# Patient Record
Sex: Female | Born: 1965 | Race: Black or African American | Hispanic: No | Smoking: Never smoker
Health system: Southern US, Community
[De-identification: ages and names within clinical notes are randomized; demographics above are authoritative.]

## PROBLEM LIST (undated history)

## (undated) DIAGNOSIS — I10 Essential (primary) hypertension: Secondary | ICD-10-CM

## (undated) DIAGNOSIS — F419 Anxiety disorder, unspecified: Secondary | ICD-10-CM

## (undated) HISTORY — PX: CERVICAL BIOPSY: SHX590

---

## 2006-01-11 ENCOUNTER — Emergency Department (HOSPITAL_COMMUNITY): Admission: EM | Admit: 2006-01-11 | Discharge: 2006-01-11 | Payer: Self-pay | Admitting: *Deleted

## 2008-08-28 ENCOUNTER — Encounter: Admission: RE | Admit: 2008-08-28 | Discharge: 2008-08-28 | Payer: Self-pay | Admitting: Obstetrics and Gynecology

## 2008-09-03 ENCOUNTER — Encounter: Admission: RE | Admit: 2008-09-03 | Discharge: 2008-09-03 | Payer: Self-pay | Admitting: Obstetrics and Gynecology

## 2011-08-20 ENCOUNTER — Other Ambulatory Visit (HOSPITAL_COMMUNITY)
Admission: RE | Admit: 2011-08-20 | Discharge: 2011-08-20 | Disposition: A | Discharge status: Home / Self Care | Payer: BC Managed Care – PPO | Source: Ambulatory Visit | Attending: Obstetrics and Gynecology | Admitting: Obstetrics and Gynecology

## 2011-08-20 ENCOUNTER — Other Ambulatory Visit: Payer: Self-pay | Admitting: Obstetrics and Gynecology

## 2011-08-20 DIAGNOSIS — Z124 Encounter for screening for malignant neoplasm of cervix: Secondary | ICD-10-CM | POA: Insufficient documentation

## 2011-08-20 DIAGNOSIS — Z1231 Encounter for screening mammogram for malignant neoplasm of breast: Secondary | ICD-10-CM

## 2011-08-20 DIAGNOSIS — Z1159 Encounter for screening for other viral diseases: Secondary | ICD-10-CM | POA: Insufficient documentation

## 2011-09-16 ENCOUNTER — Ambulatory Visit: Payer: Self-pay

## 2011-09-21 ENCOUNTER — Ambulatory Visit
Admission: RE | Admit: 2011-09-21 | Discharge: 2011-09-21 | Disposition: A | Discharge status: Home / Self Care | Payer: BC Managed Care – PPO | Source: Ambulatory Visit | Attending: Obstetrics and Gynecology | Admitting: Obstetrics and Gynecology

## 2011-09-21 DIAGNOSIS — Z1231 Encounter for screening mammogram for malignant neoplasm of breast: Secondary | ICD-10-CM

## 2012-06-22 DIAGNOSIS — B181 Chronic viral hepatitis B without delta-agent: Secondary | ICD-10-CM | POA: Insufficient documentation

## 2012-06-23 DIAGNOSIS — D509 Iron deficiency anemia, unspecified: Secondary | ICD-10-CM | POA: Insufficient documentation

## 2012-12-24 DIAGNOSIS — I10 Essential (primary) hypertension: Secondary | ICD-10-CM | POA: Insufficient documentation

## 2015-01-12 DIAGNOSIS — Q513 Bicornate uterus: Secondary | ICD-10-CM | POA: Insufficient documentation

## 2015-04-06 DIAGNOSIS — F251 Schizoaffective disorder, depressive type: Secondary | ICD-10-CM | POA: Insufficient documentation

## 2015-04-10 DIAGNOSIS — E876 Hypokalemia: Secondary | ICD-10-CM | POA: Insufficient documentation

## 2015-04-19 ENCOUNTER — Emergency Department (HOSPITAL_BASED_OUTPATIENT_CLINIC_OR_DEPARTMENT_OTHER)
Admission: EM | Admit: 2015-04-19 | Discharge: 2015-04-19 | Disposition: A | Discharge status: Other Acute Inpatient Hospital | Payer: BC Managed Care – PPO | Attending: Emergency Medicine | Admitting: Emergency Medicine

## 2015-04-19 ENCOUNTER — Encounter (HOSPITAL_BASED_OUTPATIENT_CLINIC_OR_DEPARTMENT_OTHER): Payer: Self-pay

## 2015-04-19 ENCOUNTER — Emergency Department (HOSPITAL_COMMUNITY): Payer: BC Managed Care – PPO

## 2015-04-19 ENCOUNTER — Emergency Department (HOSPITAL_COMMUNITY)
Admission: EM | Admit: 2015-04-19 | Discharge: 2015-04-19 | Disposition: A | Discharge status: Home / Self Care | Payer: BC Managed Care – PPO | Attending: Emergency Medicine | Admitting: Emergency Medicine

## 2015-04-19 ENCOUNTER — Encounter (HOSPITAL_COMMUNITY): Payer: Self-pay | Admitting: Emergency Medicine

## 2015-04-19 ENCOUNTER — Inpatient Hospital Stay (HOSPITAL_COMMUNITY)
Admission: AD | Admit: 2015-04-19 | Discharge: 2015-04-23 | DRG: 885 | Disposition: A | Discharge status: Other Acute Inpatient Hospital | Payer: BC Managed Care – PPO | Source: Intra-hospital | Attending: Emergency Medicine | Admitting: Emergency Medicine

## 2015-04-19 ENCOUNTER — Encounter (HOSPITAL_COMMUNITY): Payer: Self-pay

## 2015-04-19 DIAGNOSIS — F99 Mental disorder, not otherwise specified: Secondary | ICD-10-CM

## 2015-04-19 DIAGNOSIS — J189 Pneumonia, unspecified organism: Secondary | ICD-10-CM | POA: Diagnosis present

## 2015-04-19 DIAGNOSIS — R0602 Shortness of breath: Secondary | ICD-10-CM | POA: Diagnosis not present

## 2015-04-19 DIAGNOSIS — F419 Anxiety disorder, unspecified: Secondary | ICD-10-CM | POA: Insufficient documentation

## 2015-04-19 DIAGNOSIS — F259 Schizoaffective disorder, unspecified: Secondary | ICD-10-CM | POA: Diagnosis not present

## 2015-04-19 DIAGNOSIS — R1011 Right upper quadrant pain: Secondary | ICD-10-CM | POA: Diagnosis not present

## 2015-04-19 DIAGNOSIS — I10 Essential (primary) hypertension: Secondary | ICD-10-CM | POA: Diagnosis present

## 2015-04-19 DIAGNOSIS — Z8249 Family history of ischemic heart disease and other diseases of the circulatory system: Secondary | ICD-10-CM

## 2015-04-19 DIAGNOSIS — F061 Catatonic disorder due to known physiological condition: Secondary | ICD-10-CM | POA: Clinically undetermined

## 2015-04-19 DIAGNOSIS — K5900 Constipation, unspecified: Secondary | ICD-10-CM | POA: Diagnosis present

## 2015-04-19 DIAGNOSIS — R918 Other nonspecific abnormal finding of lung field: Secondary | ICD-10-CM | POA: Diagnosis present

## 2015-04-19 DIAGNOSIS — F251 Schizoaffective disorder, depressive type: Secondary | ICD-10-CM | POA: Diagnosis not present

## 2015-04-19 DIAGNOSIS — R000 Tachycardia, unspecified: Secondary | ICD-10-CM | POA: Insufficient documentation

## 2015-04-19 DIAGNOSIS — F339 Major depressive disorder, recurrent, unspecified: Secondary | ICD-10-CM | POA: Diagnosis present

## 2015-04-19 DIAGNOSIS — Z3202 Encounter for pregnancy test, result negative: Secondary | ICD-10-CM | POA: Insufficient documentation

## 2015-04-19 DIAGNOSIS — Z8659 Personal history of other mental and behavioral disorders: Secondary | ICD-10-CM

## 2015-04-19 DIAGNOSIS — G4700 Insomnia, unspecified: Secondary | ICD-10-CM | POA: Diagnosis present

## 2015-04-19 DIAGNOSIS — Z0000 Encounter for general adult medical examination without abnormal findings: Secondary | ICD-10-CM | POA: Diagnosis present

## 2015-04-19 DIAGNOSIS — Z79899 Other long term (current) drug therapy: Secondary | ICD-10-CM | POA: Insufficient documentation

## 2015-04-19 DIAGNOSIS — D509 Iron deficiency anemia, unspecified: Secondary | ICD-10-CM | POA: Diagnosis not present

## 2015-04-19 DIAGNOSIS — R110 Nausea: Secondary | ICD-10-CM | POA: Insufficient documentation

## 2015-04-19 DIAGNOSIS — F333 Major depressive disorder, recurrent, severe with psychotic symptoms: Secondary | ICD-10-CM | POA: Diagnosis not present

## 2015-04-19 DIAGNOSIS — R079 Chest pain, unspecified: Secondary | ICD-10-CM | POA: Diagnosis present

## 2015-04-19 DIAGNOSIS — R45851 Suicidal ideations: Secondary | ICD-10-CM | POA: Diagnosis present

## 2015-04-19 DIAGNOSIS — Z9114 Patient's other noncompliance with medication regimen: Secondary | ICD-10-CM | POA: Diagnosis not present

## 2015-04-19 HISTORY — DX: Essential (primary) hypertension: I10

## 2015-04-19 HISTORY — DX: Anxiety disorder, unspecified: F41.9

## 2015-04-19 LAB — CBC WITH DIFFERENTIAL/PLATELET
BASOS ABS: 0 10*3/uL (ref 0.0–0.1)
BASOS PCT: 0 %
EOS ABS: 0.1 10*3/uL (ref 0.0–0.7)
Eosinophils Relative: 1 %
HEMATOCRIT: 35.8 % — AB (ref 36.0–46.0)
HEMOGLOBIN: 12.4 g/dL (ref 12.0–15.0)
LYMPHS PCT: 16 %
Lymphs Abs: 1.3 10*3/uL (ref 0.7–4.0)
MCH: 26.6 pg (ref 26.0–34.0)
MCHC: 34.6 g/dL (ref 30.0–36.0)
MCV: 76.7 fL — ABNORMAL LOW (ref 78.0–100.0)
Monocytes Absolute: 0.8 10*3/uL (ref 0.1–1.0)
Monocytes Relative: 10 %
NEUTROS PCT: 73 %
Neutro Abs: 5.8 10*3/uL (ref 1.7–7.7)
Platelets: 164 10*3/uL (ref 150–400)
RBC: 4.67 MIL/uL (ref 3.87–5.11)
RDW: 17.7 % — ABNORMAL HIGH (ref 11.5–15.5)
WBC: 8 10*3/uL (ref 4.0–10.5)

## 2015-04-19 LAB — BASIC METABOLIC PANEL
ANION GAP: 7 (ref 5–15)
BUN: 7 mg/dL (ref 6–20)
CALCIUM: 9 mg/dL (ref 8.9–10.3)
CHLORIDE: 103 mmol/L (ref 101–111)
CO2: 26 mmol/L (ref 22–32)
Creatinine, Ser: 0.73 mg/dL (ref 0.44–1.00)
GFR calc non Af Amer: >60 mL/min (ref >60)
Glucose, Bld: 91 mg/dL (ref 65–99)
Potassium: 4.1 mmol/L (ref 3.5–5.1)
Sodium: 136 mmol/L (ref 135–145)

## 2015-04-19 LAB — COMPREHENSIVE METABOLIC PANEL
ALT: 24 U/L (ref 14–54)
AST: 27 U/L (ref 15–41)
Albumin: 4 g/dL (ref 3.5–5.0)
Alkaline Phosphatase: 95 U/L (ref 38–126)
Anion gap: 9 (ref 5–15)
BUN: 9 mg/dL (ref 6–20)
CO2: 25 mmol/L (ref 22–32)
Calcium: 9.1 mg/dL (ref 8.9–10.3)
Chloride: 102 mmol/L (ref 101–111)
Creatinine, Ser: 0.75 mg/dL (ref 0.44–1.00)
GFR calc Af Amer: >60 mL/min (ref >60)
GFR calc non Af Amer: >60 mL/min (ref >60)
Glucose, Bld: 90 mg/dL (ref 65–99)
Potassium: 4 mmol/L (ref 3.5–5.1)
Sodium: 136 mmol/L (ref 135–145)
Total Bilirubin: 0.7 mg/dL (ref 0.3–1.2)
Total Protein: 8.3 g/dL — ABNORMAL HIGH (ref 6.5–8.1)

## 2015-04-19 LAB — CBC
HCT: 34.1 % — ABNORMAL LOW (ref 36.0–46.0)
Hemoglobin: 11.5 g/dL — ABNORMAL LOW (ref 12.0–15.0)
MCH: 26.4 pg (ref 26.0–34.0)
MCHC: 33.7 g/dL (ref 30.0–36.0)
MCV: 78.4 fL (ref 78.0–100.0)
Platelets: 164 10*3/uL (ref 150–400)
RBC: 4.35 MIL/uL (ref 3.87–5.11)
RDW: 17.8 % — ABNORMAL HIGH (ref 11.5–15.5)
WBC: 7.7 10*3/uL (ref 4.0–10.5)

## 2015-04-19 LAB — TROPONIN I
TROPONIN I: 0.04 ng/mL — AB (ref <0.031)
Troponin I: 0.04 ng/mL — ABNORMAL HIGH (ref <0.031)

## 2015-04-19 LAB — I-STAT BETA HCG BLOOD, ED (MC, WL, AP ONLY)

## 2015-04-19 LAB — RAPID URINE DRUG SCREEN, HOSP PERFORMED
Amphetamines: NOT DETECTED
Barbiturates: NOT DETECTED
Benzodiazepines: NOT DETECTED
Cocaine: NOT DETECTED
Opiates: NOT DETECTED
Tetrahydrocannabinol: NOT DETECTED

## 2015-04-19 LAB — ETHANOL

## 2015-04-19 MED ORDER — RISPERIDONE 2 MG PO TBDP
2.0000 mg | ORAL_TABLET | Freq: Two times a day (BID) | ORAL | Status: DC
  Administered 2015-04-19 – 2015-04-21 (×4): 2 mg via ORAL
  Filled 2015-04-19: qty 1
  Filled 2015-04-19: qty 2
  Filled 2015-04-19 (×7): qty 1
  Filled 2015-04-19: qty 2

## 2015-04-19 MED ORDER — HALOPERIDOL LACTATE 5 MG/ML IJ SOLN: 10.0000 mg | Freq: Once | INTRAMUSCULAR | Status: DC

## 2015-04-19 MED ORDER — ACETAMINOPHEN 325 MG PO TABS
650.0000 mg | ORAL_TABLET | Freq: Four times a day (QID) | ORAL | Status: DC | PRN
  Administered 2015-04-20: 650 mg via ORAL
  Filled 2015-04-19 (×3): qty 2

## 2015-04-19 MED ORDER — HYDROCHLOROTHIAZIDE 25 MG PO TABS
12.5000 mg | ORAL_TABLET | Freq: Every day | ORAL | Status: DC
  Filled 2015-04-19 (×2): qty 0.5

## 2015-04-19 MED ORDER — ASPIRIN 81 MG PO CHEW: 324.0000 mg | CHEWABLE_TABLET | Freq: Once | ORAL | Status: DC

## 2015-04-19 MED ORDER — MAGNESIUM HYDROXIDE 400 MG/5ML PO SUSP: 30.0000 mL | Freq: Every day | ORAL | Status: DC | PRN

## 2015-04-19 MED ORDER — ALUM & MAG HYDROXIDE-SIMETH 200-200-20 MG/5ML PO SUSP: 30.0000 mL | ORAL | Status: DC | PRN

## 2015-04-19 NOTE — ED Provider Notes (Signed)
Patient presents with acute psychosis. She has not reported any chest pain in the ED although a troponin was checked on arrival. It was minimally elevated. Her recheck troponin also shows the same value. This could be rate related she's been tachycardic here. Her tachycardia has resolved however. She had a recent serial troponins done [facility] yesterday that were negative. She has been complaining of some intermittent chest pain over the last few weeks according to family. Her mother states that she was admitted to the hospital in [place] overnight where she had a stress test within the last 2 weeks. She's also had a negative CT angiogram of the chest within the last 2 weeks. She currently is not complaining of any chest pain. I don't feel this point that she needs to be admitted for further cardiac evaluation given her recent studies. She has been [facility] to [facility].  Rolan Bucco, MD 04/19/15 [phone]

## 2015-04-19 NOTE — ED Notes (Signed)
Dr Radford Pax states to hold IM Haldol at this time,until [facility] assessment completed. Pt calm

## 2015-04-19 NOTE — ED Notes (Signed)
Pt states her family made her come in for "chest tightness" from yesterday-pt denies CP, tightness at this time-states she was seen in the ED last night for same

## 2015-04-19 NOTE — Progress Notes (Signed)
Psychoeducational Group Note  Date:  04/19/2015 Time:  2052  Group Topic/Focus:  Wrap-Up Group:   The focus of this group is to help patients review their daily goal of treatment and discuss progress on daily workbooks.  Participation Level: Did Not Attend  Participation Quality:  Not Applicable  Affect:  Not Applicable  Cognitive:  Not Applicable  Insight:  Not Applicable  Engagement in Group: Not Applicable  Additional Comments:  The patient did not attend group since she was asleep in her bed.   Hazle Coca S 04/19/2015, 8:52 PM

## 2015-04-19 NOTE — ED Notes (Signed)
In to assess patient, patient to bathroom for approximately 5 minutes, checked on patient she states she is okay, however, patient hesitant to leave bathroom, upon return to room, patient asked to sit on stretcher so that we could talk about her reasoning for being present in ED, pt kneeled down at bedside to pray, states to give her a moment.

## 2015-04-19 NOTE — ED Provider Notes (Signed)
CSN: [id number]     Arrival date & time 04/19/15  1314 History   First MD Initiated Contact with Patient 04/19/15 1338     Chief Complaint  Patient presents with  . Psychiatric Evaluation    HPI Grace Mack is a 49 y.o. female with history of schizoaffective disorder depressed type, anxiety, hypertension, who presents to the ED due to family concern about her behavior. Per discussion with patient's son who is 47 years old he states that states that his mother has been acting differently for the last 2-3 weeks. He is unaware of any psychiatric diagnosis for his mother. He states that his mother stated that she sees spirits in the house. Conversation with other relatives (mother of patient,a nd cousin), they state that patient was just hospitalized 2 weeks ago for not taking her medications due to her schizoaffective disorder. They say patient gets hospitalized and then discharged and does not take medications. She has episodes of just driving around without knowing where she is going and the police have to find her. She was discharged from hospital on 9/26. She follows with behavioral health at [facility] office in [facility]. Patient denies any suicidal and homicidal ideation. She does endorse hearing voices. She denies any visual hallucinations. Patient states she has not taken her medications.    Past Medical History  Diagnosis Date  . Hypertension   . Anxiety    Past Surgical History  Procedure Laterality Date  . Cervical biopsy     Family History  Problem Relation Age of Onset  . CAD Father    Social History  Substance Use Topics  . Smoking status: Never Smoker   . Smokeless tobacco: None  . Alcohol Use: No   OB History    No data available     Review of Systems  Respiratory: Negative for shortness of breath.   Cardiovascular: Negative for chest pain.  Psychiatric/Behavioral: Positive for hallucinations and decreased concentration. Negative for suicidal ideas. The patient is  nervous/anxious.   Also per HPI  Allergies  Review of patient's allergies indicates no known allergies.  Home Medications   Prior to Admission medications   Medication Sig Start Date End Date Taking? Authorizing Provider  ferrous sulfate 325 (65 FE) MG tablet Take 325 mg by mouth daily.    Historical Provider, MD  hydrochlorothiazide (HYDRODIURIL) 25 MG tablet Take 12.5 mg by mouth daily.    Historical Provider, MD  risperiDONE (RISPERDAL M-TABS) 2 MG disintegrating tablet Take 2 mg by mouth 2 (two) times daily. 04/09/15 05/09/15  Historical Provider, MD   BP 113/79 mmHg  Pulse 110  Temp(Src) 97.5 F (36.4 C) (Oral)  Resp 18  Ht  (1.626 m)  Wt 160 lb (72.576 kg)  BMI 27.45 kg/m2  SpO2 98%  LMP 04/07/2015 (Approximate) Physical Exam  Constitutional: She appears well-developed and well-nourished.  HENT:  Head: Normocephalic and atraumatic.  Eyes: EOM are normal.  Neck: Normal range of motion.  Cardiovascular: Regular rhythm and intact distal pulses.  Tachycardia present.   Pulmonary/Chest: Effort normal and breath sounds normal.  Abdominal: Soft. There is no tenderness.  Musculoskeletal: Normal range of motion.  Neurological: She is alert.  Psychiatric: Her affect is blunt. Her speech is delayed. She is slowed and withdrawn. She is inattentive.    ED Course  Procedures (including critical care time) Labs Review Labs Reviewed  CBC WITH DIFFERENTIAL/PLATELET - Abnormal; Notable for the following:    HCT 35.8 (*)  MCV 76.7 (*)    RDW 17.7 (*)    Platelets 149 (*)    All other components within normal limits  COMPREHENSIVE METABOLIC PANEL - Abnormal; Notable for the following:    Total Protein 8.3 (*)    All other components within normal limits  TROPONIN I - Abnormal; Notable for the following:    Troponin I 0.04 (*)    All other components within normal limits    Imaging Review Dg Chest 2 View  04/19/2015   CLINICAL DATA:  Chest pain for 2 weeks  EXAM:  CHEST  2 VIEW  COMPARISON:  04/01/2015 chest CT  FINDINGS: Normal heart size and mediastinal contours. No acute infiltrate or edema. No effusion or pneumothorax. No acute osseous findings.  IMPRESSION: Negative chest.   Electronically Signed   By: Marnee Spring M.D.   On: 04/19/2015 01:23   I have personally reviewed and evaluated these images and lab results as part of my medical decision-making.   EKG Interpretation   Date/Time:  Friday April 19 2015 13:51:32 EDT Ventricular Rate:  110 PR Interval:  134 QRS Duration: 78 QT Interval:  330 QTC Calculation: 446 R Axis:   44 Text Interpretation:  Sinus tachycardia Anterior infarct , age  undetermined Abnormal ECG Confirmed by BEATON  MD, ROBERT ([id number]) on  04/19/2015 1:53:35 PM      MDM   Final diagnoses:  Psychiatric disturbance  History of schizoaffective disorder    Patient presents to the ED due to family concerns for patient's mental state. Patient denies any current issues. No SI or HI. She has been seen several times in the last several days for chest pain which has all been unremarkable. Patient was recently admitted to [facility] on 9/21-9/26 for schizoaffective disorder and not being compliant with medications.   ACS work-up in ED unremarkable. Does have sinus tachycardia. ACS not high on differential. Normal CT Angio about two weeks ago. Previous enzymes over last 2 weeks all normal.   In ED patient with erratic concerning behavior. She will be IVC'd. Haldol ordered. Awaiting telepscyh evaluation and most likely admission to [facility] for further psychiatric evaluation.   Caryl Ada, DO 04/19/2015, 3:38 PM PGY-2, [facility]    Pincus Large, DO 04/19/15 1546  Pincus Large, DO 04/19/15 1546  Nelva Nay, MD 04/27/15 [phone]
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
## 2015-04-19 NOTE — ED Notes (Signed)
Pt states she has been having chest pain x 1 week. She has been to 4 hospitals in 4 days. All have medically cleared her.

## 2015-04-19 NOTE — Discharge Instructions (Signed)
Chest Pain (Nonspecific) Ms. Metts, See a primary care physician within 3 days for close follow up.  If symptoms worsen, come back to the ED immediately.  Thank you. It is often hard to give a diagnosis for the cause of chest pain. There is always a chance that your pain could be related to something serious, such as a heart attack or a blood clot in the lungs. You need to follow up with your doctor. HOME CARE  If antibiotic medicine was given, take it as directed by your doctor. Finish the medicine even if you start to feel better.  For the next few days, avoid activities that bring on chest pain. Continue physical activities as told by your doctor.  Do not use any tobacco products. This includes cigarettes, chewing tobacco, and e-cigarettes.  Avoid drinking alcohol.  Only take medicine as told by your doctor.  Follow your doctor's suggestions for more testing if your chest pain does not go away.  Keep all doctor visits you made. GET HELP IF:  Your chest pain does not go away, even after treatment.  You have a rash with blisters on your chest.  You have a fever. GET HELP RIGHT AWAY IF:   You have more pain or pain that spreads to your arm, neck, jaw, back, or belly (abdomen).  You have shortness of breath.  You cough more than usual or cough up blood.  You have very bad back or belly pain.  You feel sick to your stomach (nauseous) or throw up (vomit).  You have very bad weakness.  You pass out (faint).  You have chills. This is an emergency. Do not wait to see if the problems will go away. Call your local emergency services (911 in [place] Do not drive yourself to the hospital. MAKE SURE YOU:   Understand these instructions.  Will watch your condition.  Will get help right away if you are not doing well or get worse. Document Released: 12/23/2007 Document Revised: 07/11/2013 Document Reviewed: 12/23/2007 [facility] Patient Information 2015 [place], [place]. This  information is not intended to replace advice given to you by your health care provider. Make sure you discuss any questions you have with your health care provider.
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
## 2015-04-19 NOTE — ED Notes (Signed)
Pt given ice water by request, fed frozen healthy choice chicken dinner per her preference. Calm. NAD.

## 2015-04-19 NOTE — ED Notes (Signed)
Pt with flat affect and appears anxious-she is reluctant to answer my ?s and states her family made her come to ED "this in [address], right?"-she questioned what we were going to do and i explained to her she would get an EKG and he EDP would review her chart from [facility] yesterday to see if any other tests were needed-in the middle of triage pt asked me to stop asking her ?s and she needed to use the BR-pt walked to BR across the hall and back to triage w/o difficulty-during that time her minor son Barbara Cower in the room states pt has been to to [facility] this week (pt later states she has been to "[facility]" 2 days ago" for same c/o-pt and son both deny any BH hx-pt states "except anxiety"

## 2015-04-19 NOTE — ED Provider Notes (Addendum)
CSN: [id number]     Arrival date & time 04/19/15  0019 History  By signing my name below, I, Terrance Branch, attest that this documentation has been prepared under the direction and in the presence of Tomasita Crumble, MD. Electronically Signed: Evon Slack, ED Scribe. 04/19/2015. 2:19 AM.      Chief Complaint  Patient presents with  . Chest Pain   Patient is a 49 y.o. female presenting with chest pain. The history is provided by the patient. No language interpreter was used.  Chest Pain Pain location:  Substernal area Pain radiates to:  Does not radiate Pain radiates to the back: no   Pain severity:  Mild Onset quality:  Gradual Duration:  1 week Timing:  Intermittent Chronicity:  Recurrent Relieved by:  Nothing Worsened by:  Nothing tried Associated symptoms: nausea and shortness of breath   Associated symptoms: no diaphoresis and not vomiting    HPI Comments: Grace Mack is a 49 y.o. female who presents to the Emergency Department complaining of intermittent central CP onset 1 week prior. Pt states she has associated nausea. Pt does report SOB when the pain is at its worse. Pt doesn't report any alleviating or worsening symptoms. Pt doesn't report HX of lower leg edema. Pt denies any recent travel. Pt denies taking any hormones supplements. Denies vomiting or diaphoresis. Pt states she was evaluated in [facility] ED earlier today which showed no abnormalities.    Past Medical History  Diagnosis Date  . Hypertension    Past Surgical History  Procedure Laterality Date  . Cervical biopsy     Family History  Problem Relation Age of Onset  . CAD Father    Social History  Substance Use Topics  . Smoking status: Never Smoker   . Smokeless tobacco: None  . Alcohol Use: No   OB History    No data available      Review of Systems  Constitutional: Negative for diaphoresis.  Respiratory: Positive for shortness of breath.   Cardiovascular: Positive for chest pain.   Gastrointestinal: Positive for nausea. Negative for vomiting.  All other systems reviewed and are negative.    Allergies  Review of patient's allergies indicates no known allergies.  Home Medications   Prior to Admission medications   Medication Sig Start Date End Date Taking? Authorizing Provider  ferrous sulfate 325 (65 FE) MG tablet Take 325 mg by mouth daily.   Yes Historical Provider, MD  hydrochlorothiazide (HYDRODIURIL) 25 MG tablet Take 12.5 mg by mouth daily.   Yes Historical Provider, MD  risperiDONE (RISPERDAL M-TABS) 2 MG disintegrating tablet Take 2 mg by mouth 2 (two) times daily. 04/09/15 05/09/15 Yes Historical Provider, MD   BP 110/69 mmHg  Pulse 80  Temp(Src) 98.1 F (36.7 C) (Oral)  Resp 16  Ht  (1.626 m)  Wt 160 lb (72.576 kg)  BMI 27.45 kg/m2  SpO2 99%  LMP 04/07/2015 (Approximate)   Physical Exam  Constitutional: She is oriented to person, place, and time. She appears well-developed and well-nourished. No distress.  HENT:  Head: Normocephalic and atraumatic.  Nose: Nose normal.  Mouth/Throat: Oropharynx is clear and moist. No oropharyngeal exudate.  Eyes: Conjunctivae and EOM are normal. Pupils are equal, round, and reactive to light. No scleral icterus.  Neck: Normal range of motion. Neck supple. No JVD present. No tracheal deviation present. No thyromegaly present.  Cardiovascular: Normal rate, regular rhythm and normal heart sounds.  Exam reveals no gallop and no friction rub.  No murmur heard. Pulmonary/Chest: Effort normal and breath sounds normal. No respiratory distress. She has no wheezes. She exhibits no tenderness.  Abdominal: Soft. Bowel sounds are normal. She exhibits no distension and no mass. There is no tenderness. There is no rebound and no guarding.  Musculoskeletal: Normal range of motion. She exhibits no edema or tenderness.  Lymphadenopathy:    She has no cervical adenopathy.  Neurological: She is alert and oriented to person,  place, and time. No cranial nerve deficit. She exhibits normal muscle tone.  Skin: Skin is warm and dry. No rash noted. No erythema. No pallor.  Nursing note and vitals reviewed.   ED Course  Procedures (including critical care time) DIAGNOSTIC STUDIES: Oxygen Saturation is 99% on RA, normal by my interpretation.    COORDINATION OF CARE: 2:08 AM-Discussed treatment plan with pt at bedside and pt agreed to plan.     Labs Review Labs Reviewed  CBC - Abnormal; Notable for the following:    Hemoglobin 11.5 (*)    HCT 34.1 (*)    RDW 17.8 (*)    All other components within normal limits  BASIC METABOLIC PANEL  I-STAT BETA HCG BLOOD, ED ([facility], [facility], [facility] ONLY)    Imaging Review Dg Chest 2 View  04/19/2015   CLINICAL DATA:  Chest pain for 2 weeks  EXAM: CHEST  2 VIEW  COMPARISON:  04/01/2015 chest CT  FINDINGS: Normal heart size and mediastinal contours. No acute infiltrate or edema. No effusion or pneumothorax. No acute osseous findings.  IMPRESSION: Negative chest.   Electronically Signed   By: Marnee Spring M.D.   On: 04/19/2015 01:23      EKG Interpretation   Date/Time:  Friday April 19 2015 00:47:43 EDT Ventricular Rate:  79 PR Interval:  133 QRS Duration: 75 QT Interval:  366 QTC Calculation: 419 R Axis:   12 Text Interpretation:  Sinus rhythm No significant change since last  tracing Confirmed by Erroll Luna [phone]) on 04/19/2015 2:49:31 AM      MDM   Final diagnoses:  None    Patient presents emergency department for chest pain. Her history is not consistent with ACS, troponin is not warranted... Patient is PERC  negative. I have low concern for dissection or pericarditis with this patient. She appears well and in no acute distress. She is advised to see her primary care physician for further evaluation for chest pain. Her vital signs were within her normal limits and she is safe for discharge.  I personally performed the services described in this  documentation, which was scribed in my presence. The recorded information has been reviewed and is accurate.   h  Tomasita Crumble, MD 04/19/15 1914  Tomasita Crumble, MD 04/19/15 [phone]

## 2015-04-20 ENCOUNTER — Encounter (HOSPITAL_COMMUNITY): Payer: Self-pay | Admitting: Psychiatry

## 2015-04-20 MED ORDER — BENZTROPINE MESYLATE 1 MG PO TABS
1.0000 mg | ORAL_TABLET | Freq: Two times a day (BID) | ORAL | Status: DC
  Administered 2015-04-20 – 2015-04-21 (×2): 1 mg via ORAL
  Filled 2015-04-20 (×10): qty 1

## 2015-04-20 MED ORDER — HYDROCHLOROTHIAZIDE 12.5 MG PO CAPS
12.5000 mg | ORAL_CAPSULE | Freq: Every day | ORAL | Status: DC
  Administered 2015-04-20 – 2015-04-21 (×2): 12.5 mg via ORAL
  Filled 2015-04-20 (×8): qty 1

## 2015-04-20 MED ORDER — RISPERIDONE 2 MG PO TABS
2.0000 mg | ORAL_TABLET | Freq: Two times a day (BID) | ORAL | Status: DC
  Filled 2015-04-20 (×4): qty 1

## 2015-04-20 MED ORDER — FLUOXETINE HCL 20 MG PO CAPS
20.0000 mg | ORAL_CAPSULE | Freq: Every day | ORAL | Status: DC
  Administered 2015-04-20 – 2015-04-21 (×2): 20 mg via ORAL
  Filled 2015-04-20 (×5): qty 1

## 2015-04-20 MED ORDER — LORATADINE 10 MG PO TABS
10.0000 mg | ORAL_TABLET | Freq: Every day | ORAL | Status: DC
  Administered 2015-04-20 – 2015-04-21 (×2): 10 mg via ORAL
  Filled 2015-04-20 (×9): qty 1

## 2015-04-20 NOTE — Tx Team (Signed)
Initial Interdisciplinary Treatment Plan   PATIENT STRESSORS: Marital or family conflict Medication change or noncompliance   PATIENT STRENGTHS: Average or above average intelligence General fund of knowledge Supportive family/friends   PROBLEM LIST: Problem List/Patient Goals Date to be addressed Date deferred Reason deferred Estimated date of resolution  depression 04/19/2015     anxiety 04/19/2015     psychosis 04/19/2015     "my anxiety" 04/19/2015                                    DISCHARGE CRITERIA:  Ability to meet basic life and health needs Adequate post-discharge living arrangements Improved stabilization in mood, thinking, and/or behavior Motivation to continue treatment in a less acute level of care  PRELIMINARY DISCHARGE PLAN: Participate in family therapy Return to previous work or school arrangements  PATIENT/FAMIILY INVOLVEMENT: This treatment plan has been presented to and reviewed with the patient, AUTUMN PRUITT, The patient and family have been given the opportunity to ask questions and make suggestions.  JEHU-APPIAH, Donelle Hise K 04/20/2015, 12:49 AM

## 2015-04-20 NOTE — [facility] Group Notes (Signed)
[facility] Group Notes:  (Nursing/MHT/Case Management/Adjunct)  Date:  04/20/2015  Time:0900 Type of Therapy:  Nurse Education  Participation Level:  Did Not Attend   Summary of Progress/Problems:  Dara Hoyer 04/20/2015, 9:37 AM
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
## 2015-04-20 NOTE — [facility] Group Notes (Signed)
[facility] Group Notes: (Clinical Social Work)   04/20/2015      Type of Therapy:  Group Therapy   Participation Level:  Did Not Attend despite MHT prompting   Ambrose Mantle, LCSW 04/20/2015, 12:22 PM

## 2015-04-20 NOTE — Progress Notes (Signed)
Patient ID: Grace Mack, female   DOB: 1966/01/02, 49 y.o.   MRN: [id number] D: Patient in room on approach denies pain. Pt mood and affect appeared depressed and anxious. Pt isolative mostly to room. Pt denies SI/HI/AVH.  Cooperative with assessment. No physical distress noted.  A: Met with pt 1:1. Support and encouragement provided to attend groups and engage in milieu. Pt encouraged to discuss feelings and come to staff with any question or concerns.  R: Patient remains safe.
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
## 2015-04-20 NOTE — Progress Notes (Signed)
Patient ID: Grace Mack, female   DOB: [birth year], 49 y.o.   MRN: [id number] Pt in room c/o of right sided pain. Medication given by previous nurse. Pt encouraged to rest. Upon reassessment pt was asleep. Will continue to monitor

## 2015-04-20 NOTE — Progress Notes (Signed)
Pt mother and cousin at bedside during visitation hours requesting information in regards to patients POC. Pt verbally reports she does not want nurse to exchange information and refuses to add member to the  form to exchange information. Increased confusion noted. No information exchanged to family, family expresses understanding, but requesting to speak with MD. Family left contact information with nurse, information placed on chart for MD. Julieanne Cotton notified.

## 2015-04-20 NOTE — Plan of Care (Signed)
Problem: Alteration in thought process Goal: STG-Patient does not respond to command hallucinations Outcome: Progressing Pt is paranoid but denies AVH and appeared not to be responding to command hallucination.

## 2015-04-20 NOTE — Progress Notes (Signed)
D:Pt guarded on approach. Paranoid thought process. Per patient self inventory form pt reports she slept good. She reports a good appetite, normal energy level, good concentration. She rates depression 0/10, hopelessness 0/10, anxiety 7/10- all on 0-10 scale, 10 being the worse. Pt denies SI/HI. Denies AVH. Pt reports she has no goal for the day. Did not attend nursing group this morning.  A:Special checks q 15 mins in place for safety. Medication administered per MD order(see eMAR). Encouragement and support provided.  R:Compliant with medication regimen. Safety maintained. Will continue to monitor.

## 2015-04-20 NOTE — BHH Suicide Risk Assessment (Signed)
[facility] Admission Suicide Risk Assessment   Nursing information obtained from:   patient, chart  Demographic factors:   49 year old female, lives with mother and teenaged son, unemployed  Current Mental Status:   see below  Loss Factors:   financial stressors Historical Factors:   history of schizoaffective disorder  Risk Reduction Factors:   family support Total Time spent with patient: 45 minutes Principal Problem: Schizoaffective disorder (HCC) Diagnosis:   Patient Active Problem List   Diagnosis Date Noted  . Schizoaffective disorder [F25.9] 04/19/2015  . Major depressive disorder, recurrent episode [F33.9] 04/19/2015     Continued Clinical Symptoms:  Alcohol Use Disorder Identification Test Final Score (AUDIT): 0 The "Alcohol Use Disorders Identification Test", Guidelines for Use in Primary Care, Second Edition.  World Science writer [facility]). Score between 0-7:  no or low risk or alcohol related problems. Score between 8-15:  moderate risk of alcohol related problems. Score between 16-19:  high risk of alcohol related problems. Score 20 or above:  warrants further diagnostic evaluation for alcohol dependence and treatment.   CLINICAL FACTORS:  49 year old female, history of mental illness - schizoaffective disorder.  Patient is a poor historian at this time and reports only sparse information. She states she has been depressed , and has not been able to sleep well. Endorses anhedonia.  Initially denied having hallucinations but later did endorse auditory hallucinations, but could not elaborate on them. She is unable to identify any specific trigger or stressor for her decompensation /depression at this time. She denies any alcohol or drug abuse . As per chart notes, patient was brought to hospital by family members due to concerns about disorganized behavior, hallucinations, appearing to be confused / internally preoccupied, and significant weight loss. Reportedly was recently  admitted to another hospital in September, prescribed Risperidone, but patient non compliant after discharge . Dx- Schizoaffective Disorder, Depressed  Plan- Inpatient admission, Risperidone 2 mgrs BID, Prozac 20 mgrs QDAY    Musculoskeletal: Strength & Muscle Tone: within normal limits- no cogwheeling Gait & Station: normal Patient leans: N/A  Psychiatric Specialty Exam: Physical Exam  ROS denies chest pain, denies shortness of breath, denies vomiting  Blood pressure 128/93, pulse 93, temperature 98.5 F (36.9 C), temperature source Oral, resp. rate 18, height 5' 3.78" (1.62 m), weight 158 lb (71.668 kg), last menstrual period 04/07/2015.Body mass index is 27.31 kg/(m^2).  General Appearance: Disheveled  Eye Contact::  Minimal  Speech:  Slow  Volume:  Decreased  Mood:  Depressed  Affect:  Blunt  Thought Process:  slowed, but relatively linear   Orientation:  Full (Time, Place, and Person)  Thought Content:  reports of auditory hallucinations, " Chatter", patient appears internally preoccupied at times, no delusions expressed at this time  Suicidal Thoughts:  No- patient currently denies any suicidal ideations and contracts for safety on unit  Homicidal Thoughts:  No  Memory:  recent and remote fair   Judgement:  Impaired  Insight:  Shallow  Psychomotor Activity:  Decreased  Concentration:  Fair  Recall:  [employer] of Knowledge:Good  Language: Fair  Akathisia:  Negative  Handed:  Right  AIMS (if indicated):     Assets:  Desire for Improvement Resilience Social Support  Sleep:  Number of Hours: 1  Cognition: WNL  ADL's:  Impaired     COGNITIVE FEATURES THAT CONTRIBUTE TO RISK:  Closed-mindedness and Loss of executive function    SUICIDE RISK:   Moderate:  Frequent suicidal ideation with  limited intensity, and duration, some specificity in terms of plans, no associated intent, good self-control, limited dysphoria/symptomatology, some risk factors present, and  identifiable protective factors, including available and accessible social support.  PLAN OF CARE: Patient will be admitted to inpatient psychiatric unit for stabilization and safety. Will provide and encourage milieu participation. Provide medication management and maked adjustments as needed.  Will follow daily.    Medical Decision Making:  Review of Psycho-Social Stressors (1), Review or order clinical lab tests (1), Established Problem, Worsening (2), Review of Medication Regimen & Side Effects (2) and Review of New Medication or Change in Dosage (2)  I certify that inpatient services furnished can reasonably be expected to improve the patient's condition.   Kaeleb Emond 04/20/2015, 4:24 PM

## 2015-04-20 NOTE — Progress Notes (Signed)
Did not attend group 

## 2015-04-21 DIAGNOSIS — F251 Schizoaffective disorder, depressive type: Secondary | ICD-10-CM

## 2015-04-21 LAB — LIPID PANEL
CHOLESTEROL: 134 mg/dL (ref 0–200)
HDL: 56 mg/dL (ref >40)
LDL CALC: 61 mg/dL (ref 0–99)
TRIGLYCERIDES: 83 mg/dL (ref <150)
Total CHOL/HDL Ratio: 2.4 RATIO
VLDL: 17 mg/dL (ref 0–40)

## 2015-04-21 LAB — GLUCOSE, CAPILLARY: Glucose-Capillary: 101 mg/dL — ABNORMAL HIGH (ref 65–99)

## 2015-04-21 LAB — TSH: TSH: 1.61 u[IU]/mL (ref 0.350–4.500)

## 2015-04-21 MED ORDER — LORAZEPAM 1 MG PO TABS
1.0000 mg | ORAL_TABLET | Freq: Four times a day (QID) | ORAL | Status: DC | PRN
  Administered 2015-04-22: 1 mg via ORAL
  Filled 2015-04-21: qty 1

## 2015-04-21 MED ORDER — DIPHENHYDRAMINE HCL 50 MG/ML IJ SOLN
100.0000 mg | Freq: Once | INTRAMUSCULAR | Status: AC
  Administered 2015-04-21: 100 mg via INTRAMUSCULAR
  Filled 2015-04-21: qty 2

## 2015-04-21 MED ORDER — DIPHENHYDRAMINE HCL 50 MG/ML IJ SOLN
INTRAMUSCULAR | Status: AC
  Filled 2015-04-21: qty 2

## 2015-04-21 MED ORDER — ENSURE ENLIVE PO LIQD
237.0000 mL | Freq: Three times a day (TID) | ORAL | Status: DC
  Administered 2015-04-21 – 2015-04-22 (×3): 237 mL via ORAL
  Filled 2015-04-21 (×2): qty 237

## 2015-04-21 MED ORDER — LORAZEPAM 1 MG PO TABS
1.0000 mg | ORAL_TABLET | Freq: Four times a day (QID) | ORAL | Status: DC
  Administered 2015-04-21 (×2): 1 mg via ORAL
  Filled 2015-04-21 (×2): qty 1

## 2015-04-21 MED ORDER — RISPERIDONE 2 MG PO TABS
2.0000 mg | ORAL_TABLET | Freq: Every day | ORAL | Status: DC
  Filled 2015-04-21 (×3): qty 1

## 2015-04-21 MED ORDER — LORAZEPAM 1 MG PO TABS: 1.0000 mg | ORAL_TABLET | Freq: Four times a day (QID) | ORAL | Status: DC | PRN

## 2015-04-21 MED ORDER — RISPERIDONE 3 MG PO TABS
3.0000 mg | ORAL_TABLET | Freq: Every day | ORAL | Status: DC
  Administered 2015-04-21: 3 mg via ORAL
  Filled 2015-04-21 (×3): qty 1

## 2015-04-21 NOTE — BHH Group Notes (Signed)
BHH Group Notes:  (Nursing/MHT/Case Management/Adjunct)  Date:  04/21/2015  Time: 0900  Type of Therapy:  Nurse Education  Participation Level:  Did Not Attend  Summary of Progress/Problems:  Dara Hoyer 04/21/2015, 10:16 AM

## 2015-04-21 NOTE — Progress Notes (Signed)
Patient ID: Grace Mack, female   DOB: 12-06-1965, 48 y.o.   MRN: [id number] Pt up this morning able to walk to get lab drawn. Still slow to respond and minimal interaction. Pt is safe. Will continue to monitor

## 2015-04-21 NOTE — Progress Notes (Signed)
Patient ID: Grace Mack, female   DOB: 09-28-1965, 49 y.o.   MRN: [id number] Unable to complete PSA with patient who offered minimal interaction and presented with no affect.  Carney Bern, LCSW

## 2015-04-21 NOTE — Progress Notes (Signed)
[facility] MD Progress Note  04/21/2015 11:30 AM Grace Mack  MRN:  [id number] Subjective:  Grace Mack is an 49 y.o. female that presented to [facility] via her son and mother. Pt has been to 4 hospitals in 4 days per ED notes. Pt was assessed this day by tele assessment. Pt has history of Schizoaffective Disorder Depressed Type, anxiety, and hypertension per ED notes. She states she wants to take her medication but her mother stands there and watches me take my medication like I am a two old and that is what makes me angry. I am grown now and we are on two different capacities, and some understanding between me and mom. She wants me to do it then and there, but I have a scheduleShe follows with behavioral health at [facility] office in [facility]. Patient denies SI or HI/AVH. Pt was cooperative, oriented x 4, had normal speech, blunted affect, logical/coherent thought processes, good eye contact, and was tearful when talking about feeling depressed.   She states she does not like the way the Prozac makes her feel kind of blah, talk slow like she Im walking like a turtle. Prozac slows me down, I was trying to think of words to spell and I couldn't spell them. I have to take my time. She is not willing to start another medication at this time however so we will continue with the Prozac. Pt seems to have improved since yesterday, she is much more talkative, although she is very stiff.  Principal Problem: Schizoaffective disorder (HCC) Diagnosis:   Patient Active Problem List   Diagnosis Date Noted  . Schizoaffective disorder (HCC) [F25.9] 04/19/2015  . Major depressive disorder, recurrent episode (HCC) [F33.9] 04/19/2015  . Decreased potassium in the blood [E87.6] 04/10/2015  . Schizoaffective disorder, depressive type (HCC) [F25.1] 04/06/2015  . Uterus biforus [Q51.3] 01/12/2015  . Essential (primary) hypertension [I10] 12/24/2012  . Anemia, iron deficiency [D50.9] 06/23/2012  . 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 Hypertension   . Anxiety     Past Surgical History  Procedure Laterality Date  . Cervical biopsy     Family History:  Family History  Problem Relation Age of Onset  . CAD Father    Family Psychiatric  History: None  Social History:  History  Alcohol Use No     History  Drug Use No    Social History   Social History  . Marital Status: Married    Spouse Name: N/A  . Number of Children: N/A  . Years of Education: N/A   Social History Main Topics  . Smoking status: Never Smoker   . Smokeless tobacco: None  . Alcohol Use: No  . Drug Use: No  . Sexual Activity: No   Other Topics Concern  . None   Social History Narrative   Additional Social History:   Sleep: Fair  Appetite:  Fair  Current Medications: Current Facility-Administered Medications  Medication Dose Route Frequency Provider Last Rate Last Dose  . acetaminophen (TYLENOL) tablet 650 mg  650 mg Oral Q6H PRN Worthy Flank, NP   650 mg at 04/20/15 1900  . alum & mag hydroxide-simeth (MAALOX/MYLANTA) 200-200-20 MG/5ML suspension 30 mL  30 mL Oral Q4H PRN Worthy Flank, NP      .  benztropine (COGENTIN) tablet 1 mg  1 mg Oral BID Beau Fanny, FNP   1 mg at 04/21/15 0745  . FLUoxetine (PROZAC) capsule 20 mg  20 mg Oral Daily Truman Hayward, FNP   20 mg at 04/21/15 0745  . hydrochlorothiazide (MICROZIDE) capsule 12.5 mg  12.5 mg Oral Daily Jomarie Longs, MD   12.5 mg at 04/21/15 0745  . loratadine (CLARITIN) tablet 10 mg  10 mg Oral Daily Craige Cotta, MD   10 mg at 04/21/15 0745  . LORazepam (ATIVAN) tablet 1 mg  1 mg Oral Q6H Truman Hayward, FNP   1 mg at 04/21/15 1121  . magnesium hydroxide (MILK OF MAGNESIA) suspension 30 mL  30 mL Oral Daily PRN  Worthy Flank, NP      . risperiDONE (RISPERDAL M-TABS) disintegrating tablet 2 mg  2 mg Oral BID Worthy Flank, NP   2 mg at 04/21/15 0745    Lab Results:  Results for orders placed or performed during the hospital encounter of 04/19/15 (from the past 48 hour(s))  Glucose, capillary     Status: Abnormal   Collection Time: 04/21/15  2:32 AM  Result Value Ref Range   Glucose-Capillary 101 (H) 65 - 99 mg/dL  TSH     Status: None   Collection Time: 04/21/15  6:43 AM  Result Value Ref Range   TSH 1.610 0.350 - 4.500 uIU/mL    Comment: Performed at [facility]    Physical Findings: AIMS: Facial and Oral Movements Muscles of Facial Expression: None, normal Lips and Perioral Area: None, normal Jaw: None, normal Tongue: None, normal,Extremity Movements Upper (arms, wrists, hands, fingers): None, normal Lower (legs, knees, ankles, toes): None, normal, Trunk Movements Neck, shoulders, hips: None, normal, Overall Severity Severity of abnormal movements (highest score from questions above): None, normal Incapacitation due to abnormal movements: None, normal Patient's awareness of abnormal movements (rate only patient's report): No Awareness, Dental Status Current problems with teeth and/or dentures?: No Does patient usually wear dentures?: No  CIWA:    COWS:     Musculoskeletal: Strength & Muscle Tone: cataonic and waxy flexibility Gait & Station: shuffle Patient leans: N/A  Psychiatric Specialty Exam: Review of Systems  Musculoskeletal:       Stiffness    Blood pressure 112/76, pulse 127, temperature 98.4 F (36.9 C), temperature source Oral, resp. rate 18, height 5' 3.78" (1.62 m), weight 71.668 kg (158 lb), last menstrual period 04/07/2015.Body mass index is 27.31 kg/(m^2).  General Appearance: Disheveled  Eye Contact::  Minimal  Speech:  Clear and Coherent and Slow  Volume:  Decreased  Mood:  Depressed  Affect:  Appropriate, Depressed and Flat   Thought Process:  Circumstantial, Intact and Linear  Orientation:  Full (Time, Place, and Person)  Thought Content:  WDL  Suicidal Thoughts:  No  Homicidal Thoughts:  No  Memory:  Immediate;   Fair Recent;   Fair Remote;   Fair  Judgement:  Intact  Insight:  Present  Psychomotor Activity:  Shuffling Gait  Concentration:  Fair  Recall:  Good  Fund of Knowledge:Good  Language: Good  Akathisia:  No  Handed:  Right  AIMS (if indicated):     Assets:  Communication Skills Desire for Improvement Financial Resources/Insurance Physical Health Social Support Vocational/Educational  ADL's:  Impaired  Cognition: WNL  Sleep:  Number of Hours: 4.25   Assesement: Will continue with medication management. She states the Prozac makes her slow, however during  theH&P she displayed these symptoms that she says is caused by the Prozac. We can always try Citalopram if needed.   Treatment Plan Summary: Daily contact with patient to assess and evaluate symptoms and progress in treatment and Medication management   Daily contact with patient to assess and evaluate symptoms and progress in treatment and Medication management   1 Admit for crisis management and stabilization.  2. Medication management to reduce symptoms to baseline and improved the patient's overall level of functioning. Closely monitor the side effects, efficacy and therapeutic response of medication. Will change Risperdal  po in the AM and  po QHS Will add Prozac  po daily. Will add Ativan  po QID prn for catatonia like behaviors.   3. Treat health problem as indicated.  4. Developed treatment plan to decrease the risk of relapse upon discharge and to reduce the need for readmission.  5. Psychosocial education regarding relapse prevention in self-care.  6. Healthcare followup as needed for medical problems and called consults as indicated.  7. Increase collateral information.  8. Restart home medication where  appropriate  9. Encouraged to participate and verbalize into group milieu therapy. Malachy Chamber S FNP-BC 04/21/2015, 11:30 AM Agree with Progress Note as above  Nehemiah Massed, MD

## 2015-04-21 NOTE — Progress Notes (Signed)
Initial Nutrition Assessment  DOCUMENTATION CODES:   Not applicable  INTERVENTION:  1.  Supplements; Ensure Enlive TID between meals 2.  General healthful diet; encourage intake as abe   NUTRITION DIAGNOSIS:   Inadequate oral intake related to lethargy/confusion, chronic illness as evidenced by meal completion < 25%, per patient/family report  GOAL:   Patient will meet greater than or equal to 90% of their needs  MONITOR:   PO intake, Supplement acceptance  REASON FOR ASSESSMENT:        ASSESSMENT:   Patient admitted with history of schizoaffective disorder.  Nutrition hx PTA unknown, however patient has been admitted to several hospitals recently.  Since admission has been with flat affect and minimal interaction.  Has not been eating well since admission.  Per RN, is accepting Ensure when offered.  Will make a standing order.   Diet Order:   Regular  Skin:   Intact  Last BM:  PTA  Height:   Ht Readings from Last 1 Encounters:  04/19/15 5' 3.78" (1.62 m)    Weight:   Wt Readings from Last 1 Encounters:  04/19/15 158 lb (71.668 kg)    Ideal Body Weight:   115 lbs  BMI:  Body mass index is 27.31 kg/(m^2).  Estimated Nutritional Needs:   Kcal:  1700-1850  Protein:  50-60g  Fluid:  2L/day  EDUCATION NEEDS:   Education needs addressed  Weekend RD coverage:  Loyce Dys, MS RD LDN Weekend/After hours pager: [phone]

## 2015-04-21 NOTE — Progress Notes (Signed)
D:Per patient self inventory form pt reports she slept fair last night. She reports a fair appetite, normal energy level, poor concentration. She rates depression 0/10, hopelessness 0/10, anxiety 6/10- all on 0-10 scale, 10 being the worse. Pt denies physical pain. Pt forwards little on approach. Minimal interaction noted. Slowed movements. Paranoid thought process during morning medication regimen and cup of water. Rigidity noted. Increased confusion and thought blocking noted during interaction. Pt denies SI/HI. Not attending nursing group on the unit. Increased drowsiness observed at times.   A:Special checks q 15 mins in place for safety. Medication administered per MD order(see eMAR). NP notified of pt behavior/presentation. Encouragement and support provided.   R:Safety maintained. Compliant with medication regimen. Will continue to monitor.

## 2015-04-21 NOTE — Progress Notes (Signed)
Patient ID: Grace Mack, female   DOB: 1965/08/11, 49 y.o.   MRN: [id number] Pt awake resting in bed. Responding to Clinical research associate

## 2015-04-21 NOTE — BHH Group Notes (Signed)
BHH Group Notes:  (Clinical Social Work)  04/21/2015  BHH Group Notes:  (Clinical Social Work)  04/21/2015  11:00AM-12:00PM  Summary of Progress/Problems:  The main focus of today's process group was to listen to a variety of genres of music and to identify that different types of music provoke different responses.  The patient then was able to identify personally what was soothing for them, as well as energizing.  Handouts were used to record feelings evoked, as well as how patient can personally use this knowledge in sleep habits, with depression, and with other symptoms.  The patient was in and out of the room 3 times, could not sit more than a few seconds.  Type of Therapy:  Music Therapy   Participation Level: None  Participation Quality:  inattentive  Affect:  Flat  Cognitive:  Disorganized  Insight:  None  Engagement in Therapy:  Poor  Modes of Intervention:   Activity, Exploration  Ambrose Mantle, LCSW 04/21/2015

## 2015-04-21 NOTE — Progress Notes (Signed)
Adult Psychoeducational Group Note  Date:  04/21/2015 Time:  10:31 PM  Group Topic/Focus:  Wrap-Up Group:   The focus of this group is to help patients review their daily goal of treatment and discuss progress on daily workbooks.  Participation Level:  Did Not Attend  Participation Quality:  Did not attend  Affect:  Did not attend  Cognitive:  Did not attend  Insight: None  Engagement in Group:  Did not attend  Modes of Intervention:  Did not attend  Additional Comments:  Patient did not attend wrap up group tonight.   Natsuko Kelsay L Caron Ode 04/21/2015, 10:31 PM

## 2015-04-21 NOTE — Plan of Care (Signed)
Problem: Ineffective individual coping Goal: STG: Patient will remain free from self harm Outcome: Progressing Pt has remained free from self harm     

## 2015-04-21 NOTE — Progress Notes (Signed)
Patient ID: Grace Mack, female   DOB: [birth year], 49 y.o.   MRN: [id number] Pt found on routine 15 min check awake not responding to verbal command, pt appeared catatonic, dilated, fixed pupils. VS b/p 142/83 P 99, sat 99 cbg 101. On call PA notified One time dose of 100 mg of benadryl IM given. Pt awake but slow to respond. Able answer writer's command. Took few sips of water. Will continue to monitor.

## 2015-04-22 ENCOUNTER — Encounter (HOSPITAL_COMMUNITY): Payer: Self-pay | Admitting: Emergency Medicine

## 2015-04-22 ENCOUNTER — Inpatient Hospital Stay (HOSPITAL_COMMUNITY): Payer: BC Managed Care – PPO

## 2015-04-22 DIAGNOSIS — F061 Catatonic disorder due to known physiological condition: Secondary | ICD-10-CM | POA: Clinically undetermined

## 2015-04-22 LAB — URINALYSIS, ROUTINE W REFLEX MICROSCOPIC
BILIRUBIN URINE: NEGATIVE
Glucose, UA: NEGATIVE mg/dL
Ketones, ur: NEGATIVE mg/dL
Nitrite: NEGATIVE
Protein, ur: NEGATIVE mg/dL
SPECIFIC GRAVITY, URINE: 1.014 (ref 1.005–1.030)
UROBILINOGEN UA: 1 mg/dL (ref 0.0–1.0)
pH: 6 (ref 5.0–8.0)

## 2015-04-22 LAB — CBC WITH DIFFERENTIAL/PLATELET
Basophils Absolute: 0 10*3/uL (ref 0.0–0.1)
Basophils Relative: 0 %
Eosinophils Absolute: 0.1 10*3/uL (ref 0.0–0.7)
Eosinophils Relative: 1 %
HEMATOCRIT: 34.1 % — AB (ref 36.0–46.0)
HEMOGLOBIN: 11.7 g/dL — AB (ref 12.0–15.0)
LYMPHS ABS: 1.1 10*3/uL (ref 0.7–4.0)
Lymphocytes Relative: 13 %
MCH: 26.6 pg (ref 26.0–34.0)
MCHC: 34.3 g/dL (ref 30.0–36.0)
MCV: 77.5 fL — AB (ref 78.0–100.0)
MONOS PCT: 13 %
Monocytes Absolute: 1.2 10*3/uL — ABNORMAL HIGH (ref 0.1–1.0)
NEUTROS ABS: 6.7 10*3/uL (ref 1.7–7.7)
NEUTROS PCT: 74 %
Platelets: 224 10*3/uL (ref 150–400)
RBC: 4.4 MIL/uL (ref 3.87–5.11)
RDW: 17.1 % — ABNORMAL HIGH (ref 11.5–15.5)
WBC: 9.1 10*3/uL (ref 4.0–10.5)

## 2015-04-22 LAB — URINE MICROSCOPIC-ADD ON

## 2015-04-22 LAB — COMPREHENSIVE METABOLIC PANEL
ALK PHOS: 96 U/L (ref 38–126)
ALT: 21 U/L (ref 14–54)
ANION GAP: 8 (ref 5–15)
AST: 23 U/L (ref 15–41)
Albumin: 3.8 g/dL (ref 3.5–5.0)
BILIRUBIN TOTAL: 1 mg/dL (ref 0.3–1.2)
BUN: 14 mg/dL (ref 6–20)
CALCIUM: 9.3 mg/dL (ref 8.9–10.3)
CO2: 25 mmol/L (ref 22–32)
Chloride: 102 mmol/L (ref 101–111)
Creatinine, Ser: 0.76 mg/dL (ref 0.44–1.00)
GFR calc non Af Amer: >60 mL/min (ref >60)
GLUCOSE: 90 mg/dL (ref 65–99)
Potassium: 3.7 mmol/L (ref 3.5–5.1)
Sodium: 135 mmol/L (ref 135–145)
TOTAL PROTEIN: 8.2 g/dL — AB (ref 6.5–8.1)

## 2015-04-22 LAB — LIPASE, BLOOD: Lipase: 27 U/L (ref 22–51)

## 2015-04-22 LAB — HEMOGLOBIN A1C
HEMOGLOBIN A1C: 5.1 % (ref 4.8–5.6)
Mean Plasma Glucose: 100 mg/dL

## 2015-04-22 MED ORDER — BENZTROPINE MESYLATE 1 MG PO TABS
1.0000 mg | ORAL_TABLET | Freq: Every evening | ORAL | Status: DC
  Filled 2015-04-22: qty 1

## 2015-04-22 MED ORDER — CEFTRIAXONE SODIUM 1 G IJ SOLR
1.0000 g | Freq: Once | INTRAMUSCULAR | Status: AC
  Administered 2015-04-22: 1 g via INTRAMUSCULAR
  Filled 2015-04-22: qty 10

## 2015-04-22 MED ORDER — IBUPROFEN 800 MG PO TABS: 800.0000 mg | ORAL_TABLET | Freq: Three times a day (TID) | ORAL | Status: DC

## 2015-04-22 MED ORDER — FERROUS SULFATE 325 (65 FE) MG PO TABS
325.0000 mg | ORAL_TABLET | Freq: Every day | ORAL | Status: DC
  Filled 2015-04-22 (×4): qty 1

## 2015-04-22 MED ORDER — AZITHROMYCIN 250 MG PO TABS: 250.0000 mg | ORAL_TABLET | Freq: Every day | ORAL | Status: DC

## 2015-04-22 MED ORDER — AZITHROMYCIN 250 MG PO TABS
500.0000 mg | ORAL_TABLET | Freq: Once | ORAL | Status: AC
  Administered 2015-04-22: 500 mg via ORAL
  Filled 2015-04-22: qty 2

## 2015-04-22 MED ORDER — FLUOXETINE HCL 10 MG PO CAPS
10.0000 mg | ORAL_CAPSULE | Freq: Every day | ORAL | Status: DC
Start: 2015-04-22 — End: 2015-04-22

## 2015-04-22 MED ORDER — DOCUSATE SODIUM 100 MG PO CAPS
100.0000 mg | ORAL_CAPSULE | Freq: Two times a day (BID) | ORAL | Status: DC
  Administered 2015-04-22: 100 mg via ORAL
  Filled 2015-04-22 (×9): qty 1

## 2015-04-22 MED ORDER — VENLAFAXINE HCL ER 37.5 MG PO CP24
37.5000 mg | ORAL_CAPSULE | Freq: Every day | ORAL | Status: DC
Start: 2015-04-23 — End: 2015-04-23
  Filled 2015-04-22 (×4): qty 1

## 2015-04-22 MED ORDER — STERILE WATER FOR INJECTION IJ SOLN
INTRAMUSCULAR | Status: AC
  Administered 2015-04-22: 2.1 mL
  Filled 2015-04-22: qty 10

## 2015-04-22 MED ORDER — DEXTROSE 5 % IV SOLN: 1.0000 g | Freq: Once | INTRAVENOUS | Status: DC

## 2015-04-22 MED ORDER — QUETIAPINE FUMARATE 25 MG PO TABS
25.0000 mg | ORAL_TABLET | Freq: Every day | ORAL | Status: DC
  Filled 2015-04-22 (×5): qty 1

## 2015-04-22 MED ORDER — BENZTROPINE MESYLATE 1 MG PO TABS
0.5000 mg | ORAL_TABLET | Freq: Every day | ORAL | Status: DC
  Filled 2015-04-22 (×6): qty 1

## 2015-04-22 NOTE — Progress Notes (Signed)
Subrena was transported by EMS off unit to [facility] ED accompanied by [employer].  Paperwork sent with EMS staff.

## 2015-04-22 NOTE — ED Notes (Signed)
Notified communications for need of [employer] officer for transportation for [facility].

## 2015-04-22 NOTE — Progress Notes (Signed)
DAR Note: Grace Mack has been visible on the unit.  She has attended groups. Minimal interaction with peers. She denies SI/HI or A/V hallucinations.  She did not complete her self inventory.  She was unable to talk about her goal for today.  She has not been eating and staff have encouraged her to eat meals and drink ensure.  She ate bites for lunch, drank her mid morning ensure but would not eat lunch after much encouragement.  Discussed with Dr. Elna Breslow and AM HCTZ was held due to poor fluid intake.  She reported left side pain but it has "been easing off."  She remains very slow and needs encouragement to come up for meals or medications.  She did wash up in her bathroom but wouldn't shower.  She appears to be in no physical distress.  Vital signs have been stable.  Q 15 minute checks maintained for safety.  Encouraged continued participation in group and unit activities.  We will continue to encourage her to eat her meals and drink plenty of fluids.

## 2015-04-22 NOTE — ED Notes (Signed)
Pt returned from radiology. Spoke with pharmacy concerning order for Ensure. Pharmacy reported they would send medication.

## 2015-04-22 NOTE — Progress Notes (Signed)
Attempted to call "Scottie" cousin listed on emergency contact form.  Number not listed on form and telephone number given by patient states that "there is no Scottie here."  Unable to contact mother as there is no consent to release information.

## 2015-04-22 NOTE — ED Notes (Signed)
Pt transported back to [facility] with [employer]. Pt ambulated out of facility.

## 2015-04-22 NOTE — ED Notes (Signed)
Bed: WA17 Expected date:  Expected time:  Means of arrival:  Comments: Pt from [address] [facility]- [place]- r/o appy

## 2015-04-22 NOTE — ED Notes (Signed)
Gave report to Selena Batten, RN with adult unit of [facility].

## 2015-04-22 NOTE — Progress Notes (Signed)
Grace Mack started c/o right side abdominal pain. She reports that the pain started getting worse 5 minutes ago and "I need to go to the Emergency Room."  She reports that it is an 8/10.  V/S (BP) 127/91, Temp 99.2 Oral, Pulse 113 (manual 116) and Resp 18.  Consulted with Renata Caprice NP and recommended to transport to [facility] ED to evaluate for possible appendicitis.  Called report to Warehouse manager (ED Charge Nurse).  EMS notified and will transport with police.

## 2015-04-22 NOTE — Plan of Care (Signed)
Problem: Ineffective individual coping Goal: STG: Patient will remain free from self harm Outcome: Progressing She verbalizes no thoughts to harm self at this time.

## 2015-04-22 NOTE — ED Notes (Signed)
Ultrasound at bedside

## 2015-04-22 NOTE — Discharge Instructions (Signed)

## 2015-04-22 NOTE — ED Notes (Signed)
Pt arrived via EMS from [facility] with report of having abd pain without n/v/d. BP-142/92, HR-82, RR-16. Pt is IVC. Pt reported having intermittent RUQ pain that's nonradiating.Pt reported LBM was last Monday. Abd soft/nondistended but reported tender to palpate. BS (+) and active x4 quadrants. Sitter at bedside.

## 2015-04-22 NOTE — Progress Notes (Addendum)
[facility] MD Progress Note  04/22/2015 3:09 PM Grace Mack  MRN:  [id number] Subjective:  Patient seen in bed , appears catatonic , states " I have pain, pointing to her abdomen.'   Objective;  Grace Mack is a 49 y.o. AA female who as pre initial notes in EHR ,  presented to [facility] via her son and mother. Pt had been to 4 hospitals in 4 days per ED notes for medical complaints of chest pain.Pt was medically cleared per previous notes in EHR. Pt has history of Schizoaffective Disorder Depressed Type,per ED notes.   Patient was admitted at [facility] recently - x2 per previous notes in care everywhere . Pt per EHR received Abilify Maintenna IM 400 mg in September . Pt follows up Barbette Merino at [facility] .  Also per EHR - Per her mother Grace Mack [phone] to obtain additional clinical information. Patient's mother confirmed that patient is currently employed in the billing department at [employer] and states the patient has had no mental health problems prior to her first admission to [facility]. [name] feels her daughter's current state of mind is the result of the extremely stress from work, motherhood, recent divorce (March 2016) and trying to complete a master's degree. Mother denied any previous family history of mental problems.    Pt today seen as very lethargic, tired, seen as staring off in to space and maintaining the same posture for a period or time. However she responds to redirection. Pt continues to be very somatic , c/o abdominal pain this AM , but was later seen as taking her breakfast with out any complaints. Pt also with slurred speech - likely 2/2 medications. Pt per staff continues to be depressed, delayed , withdrawn , refuses to take medications or her meals . She needs a lot of encouragement to do so.    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 Schizoaffective disorder (HCC) [F25.9] 04/19/2015  . Major depressive disorder, recurrent episode (HCC) [F33.9] 04/19/2015  . Decreased potassium in the blood [E87.6] 04/10/2015  . Schizoaffective disorder, depressive type (HCC) [F25.1] 04/06/2015  . Uterus biforus [Q51.3] 01/12/2015  . Essential (primary) hypertension [I10] 12/24/2012  . Anemia, iron deficiency [D50.9] 06/23/2012  . Chronic hepatitis B with hepatic coma (HCC) [B18.1] 06/22/2012   Total Time spent with patient: 30 minutes  Past Psychiatric History: Schizoaffective disorder per EHR - recent diagnosis .  Past Medical History:  Past Medical History  Diagnosis Date  . Hypertension   . Anxiety     Past Surgical History  Procedure Laterality Date  . Cervical biopsy     Family History:  Family History  Problem Relation Age of Onset  . CAD Father    Family Psychiatric  History: per mother - denies any hx .  Social History:  History  Alcohol Use No     History  Drug Use No    Social History   Social History  . Marital Status: Married    Spouse Name: N/A  . Number of Children: N/A  . Years of Education: N/A   Social History Main Topics  . Smoking status: Never Smoker   . Smokeless tobacco: None  . Alcohol Use: No  . Drug Use: No  . Sexual Activity: No   Other Topics Concern  . None  Social History Narrative   Additional Social History: Pt used to work at [employer] , at the same time is going to school doing higher education. She recently got divorced - in march 2016. Has a 21 y old son.   Sleep: Fair  Appetite:  Fair  Current Medications: Current Facility-Administered Medications  Medication Dose Route Frequency Provider Last Rate Last Dose  . acetaminophen (TYLENOL) tablet 650 mg  650 mg Oral Q6H PRN Worthy Flank, NP   650 mg at 04/20/15 1900  . alum & mag hydroxide-simeth (MAALOX/MYLANTA) 200-200-20 MG/5ML suspension 30 mL  30 mL Oral Q4H PRN Worthy Flank, NP      . benztropine (COGENTIN) tablet 1 mg  1 mg Oral QPM AmeLie Hollars, MD      . feeding supplement (ENSURE ENLIVE) (ENSURE ENLIVE) liquid 237 mL  237 mL Oral TID BM Ashley Jacobs, RD   237 mL at 04/22/15 1044  . hydrochlorothiazide (MICROZIDE) capsule 12.5 mg  12.5 mg Oral Daily Reese Senk, MD   12.5 mg at 04/21/15 0745  . loratadine (CLARITIN) tablet 10 mg  10 mg Oral Daily Craige Cotta, MD   10 mg at 04/21/15 0745  . LORazepam (ATIVAN) tablet 1 mg  1 mg Oral Q6H PRN Worthy Flank, NP   1 mg at 04/22/15 0043  . magnesium hydroxide (MILK OF MAGNESIA) suspension 30 mL  30 mL Oral Daily PRN Worthy Flank, NP      . risperiDONE (RISPERDAL) tablet 3 mg  3 mg Oral QHS Truman Hayward, FNP   3 mg at 04/21/15 2146  . [START ON 04/23/2015] venlafaxine XR (EFFEXOR-XR) 24 hr capsule 37.5 mg  37.5 mg Oral Q breakfast Jomarie Longs, MD        Lab Results:  Results for orders placed or performed during the hospital encounter of 04/19/15 (from the past 48 hour(s))  Glucose, capillary     Status: Abnormal   Collection Time: 04/21/15  2:32 AM  Result Value Ref Range   Glucose-Capillary 101 (H) 65 - 99 mg/dL  Hemoglobin G9F     Status: None   Collection Time: 04/21/15  6:43 AM  Result Value Ref Range   Hgb A1c MFr Bld 5.1 4.8 - 5.6 %    Comment: (NOTE)         Pre-diabetes: 5.7 - 6.4         Diabetes: >6.4         Glycemic control for adults with diabetes: <7.0    Mean Plasma Glucose 100 mg/dL    Comment: (NOTE) Performed At: [facility] [address] [place], [place] [id number] Mila Homer MD QI:[id number] Performed at [facility]   TSH     Status: None   Collection Time: 04/21/15  6:43 AM  Result Value Ref Range   TSH 1.610 0.350 - 4.500 uIU/mL    Comment: Performed at [facility]  Lipid panel     Status: None   Collection Time: 04/21/15  6:43 AM  Result Value Ref Range   Cholesterol 134 0 - 200 mg/dL    Triglycerides 83 <324 mg/dL   HDL 56 >40 mg/dL   Total CHOL/HDL Ratio 2.4 RATIO   VLDL 17 0 - 40 mg/dL   LDL Cholesterol 61 0 - 99 mg/dL    Comment:        Total Cholesterol/HDL:CHD Risk Coronary Heart Disease Risk Table  Men   Women  1/2 Average Risk   3.4   3.3  Average Risk       5.0   4.4  2 X Average Risk   9.6   7.1  3 X Average Risk  23.4   11.0        Use the calculated Patient Ratio above and the CHD Risk Table to determine the patient's CHD Risk.        ATP III CLASSIFICATION (LDL):  <100     mg/dL   Optimal  213-086  mg/dL   Near or Above                    Optimal  130-159  mg/dL   Borderline  578-469  mg/dL   High  >629     mg/dL   Very High Performed at [facility]     Physical Findings: AIMS: Facial and Oral Movements Muscles of Facial Expression: None, normal Lips and Perioral Area: None, normal Jaw: None, normal Tongue: None, normal,Extremity Movements Upper (arms, wrists, hands, fingers): None, normal Lower (legs, knees, ankles, toes): None, normal, Trunk Movements Neck, shoulders, hips: None, normal, Overall Severity Severity of abnormal movements (highest score from questions above): None, normal Incapacitation due to abnormal movements: None, normal Patient's awareness of abnormal movements (rate only patient's report): No Awareness, Dental Status Current problems with teeth and/or dentures?: No Does patient usually wear dentures?: No  CIWA:    COWS:     Musculoskeletal: Strength & Muscle Tone: cataonic and waxy flexibility Gait & Station: shuffle Patient leans: N/A  Psychiatric Specialty Exam: Review of Systems  Gastrointestinal: Positive for abdominal pain and constipation.  Musculoskeletal:       Stiffness  Psychiatric/Behavioral: Positive for depression. The patient is nervous/anxious.   All other systems reviewed and are negative.   Blood pressure 122/76, pulse 89, temperature 98.4 F (36.9 C),  temperature source Oral, resp. rate 18, height 5' 3.78" (1.62 m), weight 71.668 kg (158 lb), last menstrual period 04/07/2015, SpO2 100 %.Body mass index is 27.31 kg/(m^2).  General Appearance: Disheveled  Eye Contact::  Minimal  Speech:  Blocked, Garbled and Slow also likely due to her current medications.  Volume:  Decreased  Mood:  Depressed  Affect:  Depressed  Thought Process:  Linear and but limted  Orientation:  Full (Time, Place, and Person)  Thought Content:  Delusions, Paranoid Ideation and Rumination  Suicidal Thoughts:  No  Homicidal Thoughts:  No  Memory:  Immediate;   Fair Recent;   Fair Remote;   Poor  Judgement:  Impaired  Insight:  Shallow  Psychomotor Activity:  Decreased  Concentration:  Poor  Recall:  Fair  Fund of Knowledge:Good  Language: Good  Akathisia:  No  Handed:  Right  AIMS (if indicated):     Assets:  Communication Skills Desire for Improvement Financial Resources/Insurance Physical Health Social Support Vocational/Educational  ADL's:  Impaired  Cognition: WNL  Sleep:  Number of Hours: 4.25   Assesement: Patient presented with worsening depression , continues to be withdrawn , depressed , will need inpatient stabilization.  Treatment Plan Summary: Daily contact with patient to assess and evaluate symptoms and progress in treatment and Medication management   Will DC Risperidone - patient appears to have side effects as well as did not show response inspite of being on a good dose. Will start Seroquel 25 mg po qhs to augment the effect of Abilify IM. She received it in September  2016.  Will reduce Cogentin to 0.5 mg po qhs for EPS. Will add Effexor XR 37.5 mg po daily for affective sx. Will continue Ativan 1 mg po q6h prn for anxiety sx. Will add Ferrous sulphate 325 mg po daily for chronic anemia. Reviewed past medical records,treatment plan.  Will continue to monitor vitals ,medication compliance and treatment side effects while patient is  here.  Will monitor for medical issues as well as call consult as needed.  Reviewed labs TSH , lipid panel ,hba1c- wnl, ekg- wnl. CSW will start working on disposition.  Patient to participate in therapeutic milieu .            Evolett Somarriba MD. 04/22/2015, 3:09 PM

## 2015-04-22 NOTE — ED Provider Notes (Signed)
CSN: [id number]     Arrival date & time 04/22/15  1845 History   First MD Initiated Contact with Patient 04/22/15 1858     Chief Complaint  Patient presents with  . Abdominal Pain     (Consider location/radiation/quality/duration/timing/severity/associated sxs/prior Treatment) HPI Comments: Pt had onset of R sided pain - abdominal - sharp, then subsides and comes back - denies fevers.  No sick contacts.  Worse with movement and deep breathing, no n/v/d.  No sob / cp.  RUQ in location.  Had tylenol without relief.  Today is first day of symptoms.  Unsure if post prandial - has not had much today,.    Prior abd surgery: NONE  The history is provided by the patient.    Past Medical History  Diagnosis Date  . Hypertension   . Anxiety    Past Surgical History  Procedure Laterality Date  . Cervical biopsy     Family History  Problem Relation Age of Onset  . CAD Father    Social History  Substance Use Topics  . Smoking status: Never Smoker   . Smokeless tobacco: None  . Alcohol Use: No   OB History    No data available     Review of Systems  All other systems reviewed and are negative.     Allergies  Review of patient's allergies indicates no known allergies.  Home Medications   Prior to Admission medications   Medication Sig Start Date End Date Taking? Authorizing Provider  ferrous sulfate 325 (65 FE) MG tablet Take 325 mg by mouth daily.   Yes Historical Provider, MD  hydrochlorothiazide (HYDRODIURIL) 25 MG tablet Take 12.5 mg by mouth daily.   Yes Historical Provider, MD  risperiDONE (RISPERDAL M-TABS) 2 MG disintegrating tablet Take 2 mg by mouth 2 (two) times daily. 04/09/15 05/09/15 Yes Historical Provider, MD  azithromycin (ZITHROMAX Z-PAK) 250 MG tablet Take 1 tablet (250 mg total) by mouth daily.  PO day 1, then  PO days 205 04/22/15   Eber Hong, MD  ibuprofen (ADVIL,MOTRIN) 800 MG tablet Take 1 tablet (800 mg total) by mouth 3 (three) times  daily. 04/22/15   Eber Hong, MD   BP 130/84 mmHg  Pulse 95  Temp(Src) 98.3 F (36.8 C) (Oral)  Resp 18  Ht 5' 3.78" (1.62 m)  Wt 158 lb (71.668 kg)  BMI 27.31 kg/m2  SpO2 96%  LMP 04/07/2015 (Approximate) Physical Exam  Constitutional: She appears well-developed and well-nourished. No distress.  HENT:  Head: Normocephalic and atraumatic.  Mouth/Throat: Oropharynx is clear and moist. No oropharyngeal exudate.  Eyes: Conjunctivae and EOM are normal. Pupils are equal, round, and reactive to light. Right eye exhibits no discharge. Left eye exhibits no discharge. No scleral icterus.  Neck: Normal range of motion. Neck supple. No JVD present. No thyromegaly present.  Cardiovascular: Normal rate, regular rhythm, normal heart sounds and intact distal pulses.  Exam reveals no gallop and no friction rub.   No murmur heard. Pulmonary/Chest: Effort normal and breath sounds normal. No respiratory distress. She has no wheezes. She has no rales.  Abdominal: Soft. Bowel sounds are normal. She exhibits no distension and no mass. There is tenderness ( RUQ ttp moderate, with guarding - RLQ - mild.   ). There is guarding.  Musculoskeletal: Normal range of motion. She exhibits no edema or tenderness.  Lymphadenopathy:    She has no cervical adenopathy.  Neurological: She is alert. Coordination normal.  Skin: Skin is warm and dry.  No rash noted. No erythema.  Psychiatric: She has a normal mood and affect. Her behavior is normal.  Nursing note and vitals reviewed.   ED Course  Procedures (including critical care time) Labs Review Labs Reviewed  GLUCOSE, CAPILLARY - Abnormal; Notable for the following:    Glucose-Capillary 101 (*)    All other components within normal limits  CBC WITH DIFFERENTIAL/PLATELET - Abnormal; Notable for the following:    Hemoglobin 11.7 (*)    HCT 34.1 (*)    MCV 77.5 (*)    RDW 17.1 (*)    Monocytes Absolute 1.2 (*)    All other components within normal limits   COMPREHENSIVE METABOLIC PANEL - Abnormal; Notable for the following:    Total Protein 8.2 (*)    All other components within normal limits  URINALYSIS, ROUTINE W REFLEX MICROSCOPIC (NOT AT [facility]) - Abnormal; Notable for the following:    Hgb urine dipstick SMALL (*)    Leukocytes, UA TRACE (*)    All other components within normal limits  URINE MICROSCOPIC-ADD ON - Abnormal; Notable for the following:    Squamous Epithelial / LPF FEW (*)    All other components within normal limits  HEMOGLOBIN A1C  TSH  LIPID PANEL  LIPASE, BLOOD    Imaging Review Dg Chest 2 View  04/22/2015   CLINICAL DATA:  Intermittent right upper quadrant pain.  EXAM: CHEST  2 VIEW  COMPARISON:  04/19/2015  FINDINGS: There are patchy airspace opacities in the right middle lobe and right lower lobe, suspicious for pneumonia. Minimal linear opacity is present in the left base which may represent scarring or atelectasis. There is no pleural effusion. Pulmonary vasculature is normal. Hilar and mediastinal contours are unremarkable. Heart size is normal and unchanged.  IMPRESSION: Right middle lobe and right lower lobe infiltrates, suspicious for pneumonia. Followup PA and lateral chest X-ray is recommended in 3-4 weeks following trial of antibiotic therapy to ensure resolution and exclude underlying malignancy.   Electronically Signed   By: Ellery Plunk M.D.   On: 04/22/2015 21:11   US Abdomen Complete  04/22/2015   CLINICAL DATA:  Right upper quadrant abdominal pain for 1 day.  EXAM: ULTRASOUND ABDOMEN COMPLETE  COMPARISON:  None.  FINDINGS: Gallbladder: No gallstones or wall thickening visualized. No sonographic Murphy sign noted.  Common bile duct: Diameter: 2.9 mm  Liver: No focal lesion identified. Within normal limits in parenchymal echogenicity.  IVC: No abnormality visualized.  Pancreas: Visualized portion unremarkable.  Spleen: Size and appearance within normal limits.  Right Kidney: Length: 10.4 cm. Echogenicity  within normal limits. No mass or hydronephrosis visualized.  Left Kidney: Length: 9.6 cm. Echogenicity within normal limits. No mass or hydronephrosis visualized.  Abdominal aorta: No aneurysm visualized.  Other findings: None.  IMPRESSION: Normal.   Electronically Signed   By: Ellery Plunk M.D.   On: 04/22/2015 21:03   I have personally reviewed and evaluated these images and lab results as part of my medical decision-making.   EKG Interpretation None      MDM   Final diagnoses:  CAP (community acquired pneumonia)    R/o - chole, panc.  Despite no hypoxia, tachycardia or fever or tachypnea the patient does have pneumonia which is evident on chest x-ray. Ultrasound negative, labs show that the patient has no leukocytosis, minimal anemia, normal renal function. The patient is stable for transfer back to behavioral health.  Meds given in ED:  azithromycin [facility]) tablet 500 mg (500 mg Oral Given 04/22/15 2151)  cefTRIAXone (ROCEPHIN) injection 1 g (1 g Intramuscular Given 04/22/15 2152)      New Prescriptions   AZITHROMYCIN (ZITHROMAX Z-PAK) 250 MG TABLET    Take 1 tablet (250 mg total) by mouth daily.  PO day 1, then  PO days 205   IBUPROFEN (ADVIL,MOTRIN) 800 MG TABLET    Take 1 tablet (800 mg total) by mouth 3 (three) times daily.      Eber Hong, MD 04/22/15 2325

## 2015-04-22 NOTE — Tx Team (Signed)
Interdisciplinary Treatment Plan Update (Adult)  Date:  04/22/2015   Time Reviewed:  8:41 AM   Progress in Treatment: Attending groups: Yes. Participating in groups:  Yes. Taking medication as prescribed:  Yes. Tolerating medication:  Yes. Family/Significant othe contact made:  Yes Patient understands diagnosis:  Yes AEB asking for help with "excessive worry" Discussing patient identified problems/goals with staff:  Yes, see initial care plan. Medical problems stabilized or resolved:  Yes. Denies suicidal/homicidal ideation: Yes. Issues/concerns per patient self-inventory:  No. Other:  New problem(s) identified:  Discharge Plan or Barriers: return home, follow up outpt  Reason for Continuation of Hospitalization: Depression Medication stabilization Other; describe Disorganization  Comments:  Grace Mack is a 49 y.o. female with history of schizoaffective disorder depressed type, anxiety, hypertension, who presents to the ED due to family concern about her behavior. Per discussion with patient's son who is 49 years old he states that states that his mother has been acting differently for the last 2-3 weeks. He is unaware of any psychiatric diagnosis for his mother. He states that his mother stated that she sees spirits in the house. Conversation with other relatives (mother of patient,a nd cousin), they state that patient was just hospitalized 2 weeks ago for not taking her medications due to her schizoaffective disorder. They say patient gets hospitalized and then discharged and does not take medications. She has episodes of just driving around without knowing where she is going and the police have to find her. She was discharged from hospital on 9/26. She follows with behavioral health at [facility] office in [facility]. Patient denies any suicidal and homicidal ideation. She does endorse hearing voices. She denies any visual hallucinations. Patient states she has not taken her medications.     Seroquel, Effexor trial  Estimated length of stay: 4-5 days  New goal(s):  Review of initial/current patient goals per problem list:   Review of initial/current patient goals per problem list:  1. Goal(s): Patient will participate in aftercare plan   Met: Yes   Target date: 3-5 days post admission date   As evidenced by: Patient will participate within aftercare plan AEB aftercare provider and housing plan at discharge being identified. Pt plans to return home, follow up outpt.  Goal met.  R Kenan Moodie LCSW 04/22/2015    2. Goal (s): Patient will exhibit decreased depressive symptoms and suicidal ideations.   Met: No   Target date: 3-5 days post admission date   As evidenced by: Patient will utilize self rating of depression at 3 or below and demonstrate decreased signs of depression or be deemed stable for discharge by MD. 04/22/15  Pt rates anxiety a 4 today.  In conjunction, demonstrates a degree of thought blocking/latent responses to questions.    3. Goal(s): Patient will demonstrate decreased signs and symptoms of anxiety.   Met: No   Target date: 3-5 days post admission date   As evidenced by: Patient will utilize self rating of anxiety at 3 or below and demonstrated decreased signs of anxiety, or be deemed stable for discharge by MD 04/22/15  Pt rates anxiety a 6 today.         Attendees: Patient:  04/22/2015 8:41 AM   Family:   04/22/2015 8:41 AM   Physician:  Ursula Alert, MD 04/22/2015 8:41 AM   Nursing:   Manuella Ghazi, RN 04/22/2015 8:41 AM   CSW:    Roque Lias, LCSW   04/22/2015 8:41 AM   Other:  04/22/2015 8:41  AM   Other:   04/22/2015 8:41 AM   Other:  Lars Pinks, Nurse CM 04/22/2015 8:41 AM   Other:  Lucinda Dell, [facility] 04/22/2015 8:41 AM   Other:  Norberto Sorenson, [place]  04/22/2015 8:41 AM   Other:  04/22/2015 8:41 AM   Other:  04/22/2015 8:41 AM   Other:  04/22/2015 8:41 AM   Other:  04/22/2015 8:41 AM   Other:  04/22/2015 8:41 AM    Other:   04/22/2015 8:41 AM    Scribe for Treatment Team:   Trish Mage, 04/22/2015 8:41 AM

## 2015-04-23 ENCOUNTER — Observation Stay (HOSPITAL_COMMUNITY)
Admission: AD | Admit: 2015-04-23 | Discharge: 2015-04-25 | Disposition: A | Discharge status: Home / Self Care | Payer: BC Managed Care – PPO | Source: Ambulatory Visit | Attending: Internal Medicine | Admitting: Internal Medicine

## 2015-04-23 DIAGNOSIS — F333 Major depressive disorder, recurrent, severe with psychotic symptoms: Secondary | ICD-10-CM

## 2015-04-23 DIAGNOSIS — R1011 Right upper quadrant pain: Secondary | ICD-10-CM | POA: Insufficient documentation

## 2015-04-23 DIAGNOSIS — Z9114 Patient's other noncompliance with medication regimen: Secondary | ICD-10-CM | POA: Insufficient documentation

## 2015-04-23 DIAGNOSIS — I10 Essential (primary) hypertension: Secondary | ICD-10-CM | POA: Insufficient documentation

## 2015-04-23 DIAGNOSIS — F259 Schizoaffective disorder, unspecified: Secondary | ICD-10-CM | POA: Diagnosis present

## 2015-04-23 DIAGNOSIS — J189 Pneumonia, unspecified organism: Principal | ICD-10-CM | POA: Insufficient documentation

## 2015-04-23 DIAGNOSIS — F419 Anxiety disorder, unspecified: Secondary | ICD-10-CM | POA: Insufficient documentation

## 2015-04-23 DIAGNOSIS — F251 Schizoaffective disorder, depressive type: Secondary | ICD-10-CM | POA: Insufficient documentation

## 2015-04-23 DIAGNOSIS — F061 Catatonic disorder due to known physiological condition: Secondary | ICD-10-CM | POA: Diagnosis not present

## 2015-04-23 DIAGNOSIS — F339 Major depressive disorder, recurrent, unspecified: Secondary | ICD-10-CM | POA: Insufficient documentation

## 2015-04-23 DIAGNOSIS — Z79899 Other long term (current) drug therapy: Secondary | ICD-10-CM | POA: Insufficient documentation

## 2015-04-23 DIAGNOSIS — D509 Iron deficiency anemia, unspecified: Secondary | ICD-10-CM | POA: Insufficient documentation

## 2015-04-23 MED ORDER — LORAZEPAM 2 MG/ML IJ SOLN
1.0000 mg | Freq: Once | INTRAMUSCULAR | Status: AC
  Administered 2015-04-23: 1 mg via INTRAMUSCULAR
  Filled 2015-04-23: qty 1

## 2015-04-23 MED ORDER — AZITHROMYCIN 250 MG PO TABS
250.0000 mg | ORAL_TABLET | Freq: Every day | ORAL | Status: DC
  Filled 2015-04-23 (×2): qty 1

## 2015-04-23 MED ORDER — ARIPIPRAZOLE ER 400 MG IM SUSR: 400.0000 mg | INTRAMUSCULAR | Status: DC

## 2015-04-23 MED ORDER — INFLUENZA VAC SPLIT QUAD 0.5 ML IM SUSY
0.5000 mL | PREFILLED_SYRINGE | INTRAMUSCULAR | Status: DC
  Filled 2015-04-23 (×2): qty 0.5

## 2015-04-23 MED ORDER — IBUPROFEN 200 MG PO TABS
600.0000 mg | ORAL_TABLET | Freq: Four times a day (QID) | ORAL | Status: DC | PRN
Start: 2015-04-23 — End: 2015-04-23

## 2015-04-23 MED ORDER — AZITHROMYCIN 250 MG PO TABS
500.0000 mg | ORAL_TABLET | Freq: Every day | ORAL | Status: DC
  Filled 2015-04-23: qty 1

## 2015-04-23 MED ORDER — ENOXAPARIN SODIUM 40 MG/0.4ML ~~LOC~~ SOLN
40.0000 mg | SUBCUTANEOUS | Status: DC
  Filled 2015-04-23 (×3): qty 0.4

## 2015-04-23 MED ORDER — MEGESTROL ACETATE 400 MG/10ML PO SUSP
400.0000 mg | Freq: Every day | ORAL | Status: DC
  Filled 2015-04-23 (×3): qty 10

## 2015-04-23 MED ORDER — LEVOFLOXACIN IN D5W 750 MG/150ML IV SOLN
750.0000 mg | Freq: Every day | INTRAVENOUS | Status: DC
  Administered 2015-04-23: 750 mg via INTRAVENOUS
  Filled 2015-04-23: qty 150

## 2015-04-23 MED ORDER — LORAZEPAM 1 MG PO TABS: 1.0000 mg | ORAL_TABLET | Freq: Two times a day (BID) | ORAL | Status: DC

## 2015-04-23 NOTE — ED Notes (Signed)
Per EMS: pt coming from [facility], states pt will not respond verbally or actively to staff. Pt NAD.

## 2015-04-23 NOTE — [facility] Group Notes (Signed)
[facility] LCSW Group Therapy  04/23/2015 , 1:21 PM   Type of Therapy:  Group Therapy  Participation Level:  Invited.  Did not attend  Summary of Progress/Problems: Today's group focused on the term Diagnosis.  Participants were asked to define the term, and then pronounce whether it is a negative, positive or neutral term.  Daryel Gerald B 04/23/2015 , 1:21 PM

## 2015-04-23 NOTE — ED Notes (Signed)
Attempted to call report to floor. Nurse will call back. 

## 2015-04-23 NOTE — ED Notes (Signed)
Bed: [facility] Expected date:  Expected time:  Means of arrival:  Comments: EMS FROM [facility]

## 2015-04-23 NOTE — ED Notes (Signed)
When patient arrived this tech tried to obtain vital signs. Patient not responding to verbal commands. Patient has clenched teeth together and didn't allow this tech to take temp. This tech then tried to shake patient to wake her patient didn't respond. This tech and rn then placed an ammonia stick under her nose and patient moved head away and grimaced.

## 2015-04-23 NOTE — Progress Notes (Signed)
  Psychiatry update: Patient seen in bed , not responding to any questions. Pt has been refusing to take any of her medications since AM. Pt has a new diagnosis of pneumonia and is not taking her PO antibiotics. Pt has not had any fluids or meals all day today . Pt has been in bed all day , with closed eyes , not responding to verbal stimuli.  Pt to be send to ED for evaluation.   Jomarie Longs ,MD [facility]

## 2015-04-23 NOTE — Progress Notes (Signed)
   D: Pt arrived back on the unit and was adamant about getting her "paper work" that she was given at the hosp. Writer gave the paper work and went over some with the pt. Informed pt that Rx's would be held for the md. Attempted to give pt scheduled meds, however, she refused.   A:  Support and encouragement was offered. 15 min checks continued for safety.  R: Pt remains safe.

## 2015-04-23 NOTE — H&P (Signed)
[facility] History and Physical  Grace Mack RUE:[id number] DOB: Nov 14, 1965 DOA: 04/19/2015  Referring physician: er PCP: No primary care provider on file.   Chief Complaint:   HPI: Grace Mack is a 49 y.o. female  Who was at [facility], admitted on 10/1.  She was sent to the ER on 10/3 for abd pain.   U/A was negative.  SHe was sent to the ER AGAIN on 10/4 as they said she was catatonic.  She was refusing to take her abx and other meds.  She was given 2 doses of ativan at [facility].    She had a normal CT Angio on 04/01/15.  Pt also had a CT head w/o contrast done ON 04/01/15- Negative for any acute pathology. Pt had Chest xray - 04/19/15- which was negative. However chest xray done - 04/22/15- positive for right lower lobe infiltrate -   In the ER, patient would say "help me".  When asked in her stomach hurt, she said "yes".    hopsitalist were asked to treat for PNA   Review of Systems:  All systems reviewed, negative unless stated above    Past Medical History  Diagnosis Date  . Hypertension   . Anxiety    Past Surgical History  Procedure Laterality Date  . Cervical biopsy     Social History:  reports that she has never smoked. She does not have any smokeless tobacco history on file. She reports that she does not drink alcohol or use illicit drugs.  No Known Allergies  Family History  Problem Relation Age of Onset  . CAD Father     Prior to Admission medications   Medication Sig Start Date End Date Taking? Authorizing Provider  ferrous sulfate 325 (65 FE) MG tablet Take 325 mg by mouth daily.   Yes Historical Provider, MD  hydrochlorothiazide (HYDRODIURIL) 25 MG tablet Take 12.5 mg by mouth daily.   Yes Historical Provider, MD  risperiDONE (RISPERDAL M-TABS) 2 MG disintegrating tablet Take 2 mg by mouth 2 (two) times daily. 04/09/15 05/09/15 Yes Historical Provider, MD  azithromycin (ZITHROMAX Z-PAK) 250 MG tablet Take 1 tablet (250 mg total) by mouth daily.  PO day  1, then  PO days 205 04/22/15   Eber Hong, MD  ibuprofen (ADVIL,MOTRIN) 800 MG tablet Take 1 tablet (800 mg total) by mouth 3 (three) times daily. 04/22/15   Eber Hong, MD   Physical Exam: Filed Vitals:   04/22/15 2321 04/23/15 0600 04/23/15 1400 04/23/15 1443  BP: 130/84 118/64 124/73 122/68  Pulse: 95 86 72 68  Temp:  98.3 F (36.8 C) 98.2 F (36.8 C)   TempSrc:  Oral Axillary   Resp: Height:      Weight:      SpO2: 96%   98%    Wt Readings from Last 3 Encounters:  04/19/15 71.668 kg (158 lb)  04/19/15 72.576 kg (160 lb)  04/19/15 72.576 kg (160 lb)    General:  Laying in bed, eyes closed, will say a few words Eyes: PERRL, normal lids, irises & conjunctiva ENT: grossly normal hearing, lips & tongue Neck: no LAD, masses or thyromegaly Cardiovascular: RRR, no m/r/g. No LE edema. Telemetry: SR, no arrhythmias  Respiratory: CTA anteriorly, no w/r/r. Normal respiratory effort. Abdomen: soft, ntnd Skin: no rash or induration seen on limited exam- in hallway Musculoskeletal: grossly normal tone BUE/BLE Psychiatric: flat, will say a few words  Labs on Admission:  Basic Metabolic Panel:  Recent Labs Lab 04/19/15 0206 04/19/15 1423 04/22/15 2005  NA 136 136 135  K 4.1 4.0 3.7  CL 103 102 102  CO2 GLUCOSE 91 90 90  BUN CREATININE 0.73 0.75 0.76  CALCIUM 9.0 9.1 9.3   Liver Function Tests:  Recent Labs Lab 04/19/15 1423 04/22/15 2005  AST 27 23  ALT 24 21  ALKPHOS 95 96  BILITOT 0.7 1.0  PROT 8.3* 8.2*  ALBUMIN 4.0 3.8    Recent Labs Lab 04/22/15 2005  LIPASE 27   No results for input(s): AMMONIA in the last 168 hours. CBC:  Recent Labs Lab 04/19/15 0206 04/19/15 1423 04/22/15 2005  WBC 7.7 8.0 9.1  NEUTROABS  --  5.8 6.7  HGB 11.5* 12.4 11.7*  HCT 34.1* 35.8* 34.1*  MCV 78.4 76.7* 77.5*  PLT 164 164 224   Cardiac Enzymes:  Recent Labs Lab 04/19/15 1423 04/19/15 1615  TROPONINI 0.04*  0.04*    BNP (last 3 results) No results for input(s): BNP in the last 8760 hours.  ProBNP (last 3 results) No results for input(s): PROBNP in the last 8760 hours.  CBG:  Recent Labs Lab 04/21/15 0232  GLUCAP 101*    Radiological Exams on Admission: Dg Chest 2 View  04/22/2015   CLINICAL DATA:  Intermittent right upper quadrant pain.  EXAM: CHEST  2 VIEW  COMPARISON:  04/19/2015  FINDINGS: There are patchy airspace opacities in the right middle lobe and right lower lobe, suspicious for pneumonia. Minimal linear opacity is present in the left base which may represent scarring or atelectasis. There is no pleural effusion. Pulmonary vasculature is normal. Hilar and mediastinal contours are unremarkable. Heart size is normal and unchanged.  IMPRESSION: Right middle lobe and right lower lobe infiltrates, suspicious for pneumonia. Followup PA and lateral chest X-ray is recommended in 3-4 weeks following trial of antibiotic therapy to ensure resolution and exclude underlying malignancy.   Electronically Signed   By: Ellery Plunk M.D.   On: 04/22/2015 21:11   US Abdomen Complete  04/22/2015   CLINICAL DATA:  Right upper quadrant abdominal pain for 1 day.  EXAM: ULTRASOUND ABDOMEN COMPLETE  COMPARISON:  None.  FINDINGS: Gallbladder: No gallstones or wall thickening visualized. No sonographic Murphy sign noted.  Common bile duct: Diameter: 2.9 mm  Liver: No focal lesion identified. Within normal limits in parenchymal echogenicity.  IVC: No abnormality visualized.  Pancreas: Visualized portion unremarkable.  Spleen: Size and appearance within normal limits.  Right Kidney: Length: 10.4 cm. Echogenicity within normal limits. No mass or hydronephrosis visualized.  Left Kidney: Length: 9.6 cm. Echogenicity within normal limits. No mass or hydronephrosis visualized.  Abdominal aorta: No aneurysm visualized.  Other findings: None.  IMPRESSION: Normal.   Electronically Signed   By: Ellery Plunk M.D.    On: 04/22/2015 21:03      Assessment/Plan Principal Problem:   Schizoaffective disorder (HCC) Active Problems:   Major depressive disorder, recurrent episode (HCC)   Catatonia (HCC)   Pneumonia   Pneumonia -IV levaquin for now- change to PO when able to take ? Aspiration-- SLP eveal  Catatonia/MDD -spoke with me in ER -defer to psych who I called and consulted at [facility] on 10/4  Abdominal pain -recent U/S ok Lipase in AM -liver enzymes ok  Psych to see  Code Status: full DVT Prophylaxis: Family Communication: patient Disposition Plan:   Time spent: 35 min  Marlin Canary [facility] Pager [phone]

## 2015-04-23 NOTE — ED Provider Notes (Signed)
CSN: [id number]     Arrival date & time 04/22/15  1845 History   First MD Initiated Contact with Patient 04/22/15 1858     Chief Complaint  Patient presents with  . Abdominal Pain     (Consider location/radiation/quality/duration/timing/severity/associated sxs/prior Treatment) Patient is a 49 y.o. female presenting with abdominal pain. The history is provided by medical records.  Abdominal Pain  patient transferred from behavioral health. She was admitted over there yesterday for schizoaffective disorder. She is now catatonic. Transferred to the ER because she refuses to take her oral medications for her pneumonia. Patient was not responding to me cannot participate in her history.  Past Medical History  Diagnosis Date  . Hypertension   . Anxiety    Past Surgical History  Procedure Laterality Date  . Cervical biopsy     Family History  Problem Relation Age of Onset  . CAD Father    Social History  Substance Use Topics  . Smoking status: Never Smoker   . Smokeless tobacco: None  . Alcohol Use: No   OB History    No data available     Review of Systems  Unable to perform ROS Gastrointestinal: Positive for abdominal pain.      Allergies  Review of patient's allergies indicates no known allergies.  Home Medications   Prior to Admission medications   Medication Sig Start Date End Date Taking? Authorizing Provider  ferrous sulfate 325 (65 FE) MG tablet Take 325 mg by mouth daily.   Yes Historical Provider, MD  hydrochlorothiazide (HYDRODIURIL) 25 MG tablet Take 12.5 mg by mouth daily.   Yes Historical Provider, MD  risperiDONE (RISPERDAL M-TABS) 2 MG disintegrating tablet Take 2 mg by mouth 2 (two) times daily. 04/09/15 05/09/15 Yes Historical Provider, MD  azithromycin (ZITHROMAX Z-PAK) 250 MG tablet Take 1 tablet (250 mg total) by mouth daily.  PO day 1, then  PO days 205 04/22/15   Eber Hong, MD  ibuprofen (ADVIL,MOTRIN) 800 MG tablet Take 1 tablet (800  mg total) by mouth 3 (three) times daily. 04/22/15   Eber Hong, MD   BP 122/68 mmHg  Pulse 68  Temp(Src) 98.2 F (36.8 C) (Axillary)  Resp 16  Ht 5' 3.78" (1.62 m)  Wt 158 lb (71.668 kg)  BMI 27.31 kg/m2  SpO2 98%  LMP 04/07/2015 (Approximate) Physical Exam  Constitutional: She appears well-developed.  Eyes:  Patient will hold her eyes closed.    Cardiovascular: Normal rate and regular rhythm.   Pulmonary/Chest: Effort normal and breath sounds normal.  Abdominal: Soft.  Musculoskeletal: Normal range of motion.  Neurological:  Patient will not follow commands. Breathing spontaneously  Skin: Skin is warm.    ED Course  Procedures (including critical care time) Labs Review Labs Reviewed  GLUCOSE, CAPILLARY - Abnormal; Notable for the following:    Glucose-Capillary 101 (*)    All other components within normal limits  CBC WITH DIFFERENTIAL/PLATELET - Abnormal; Notable for the following:    Hemoglobin 11.7 (*)    HCT 34.1 (*)    MCV 77.5 (*)    RDW 17.1 (*)    Monocytes Absolute 1.2 (*)    All other components within normal limits  COMPREHENSIVE METABOLIC PANEL - Abnormal; Notable for the following:    Total Protein 8.2 (*)    All other components within normal limits  URINALYSIS, ROUTINE W REFLEX MICROSCOPIC (NOT AT [facility]) - Abnormal; Notable for the following:    Hgb urine dipstick SMALL (*)  Leukocytes, UA TRACE (*)    All other components within normal limits  URINE MICROSCOPIC-ADD ON - Abnormal; Notable for the following:    Squamous Epithelial / LPF FEW (*)    All other components within normal limits  HEMOGLOBIN A1C  TSH  LIPID PANEL  LIPASE, BLOOD    Imaging Review Dg Chest 2 View  04/22/2015   CLINICAL DATA:  Intermittent right upper quadrant pain.  EXAM: CHEST  2 VIEW  COMPARISON:  04/19/2015  FINDINGS: There are patchy airspace opacities in the right middle lobe and right lower lobe, suspicious for pneumonia. Minimal linear opacity is present in  the left base which may represent scarring or atelectasis. There is no pleural effusion. Pulmonary vasculature is normal. Hilar and mediastinal contours are unremarkable. Heart size is normal and unchanged.  IMPRESSION: Right middle lobe and right lower lobe infiltrates, suspicious for pneumonia. Followup PA and lateral chest X-ray is recommended in 3-4 weeks following trial of antibiotic therapy to ensure resolution and exclude underlying malignancy.   Electronically Signed   By: Ellery Plunk M.D.   On: 04/22/2015 21:11   US Abdomen Complete  04/22/2015   CLINICAL DATA:  Right upper quadrant abdominal pain for 1 day.  EXAM: ULTRASOUND ABDOMEN COMPLETE  COMPARISON:  None.  FINDINGS: Gallbladder: No gallstones or wall thickening visualized. No sonographic Murphy sign noted.  Common bile duct: Diameter: 2.9 mm  Liver: No focal lesion identified. Within normal limits in parenchymal echogenicity.  IVC: No abnormality visualized.  Pancreas: Visualized portion unremarkable.  Spleen: Size and appearance within normal limits.  Right Kidney: Length: 10.4 cm. Echogenicity within normal limits. No mass or hydronephrosis visualized.  Left Kidney: Length: 9.6 cm. Echogenicity within normal limits. No mass or hydronephrosis visualized.  Abdominal aorta: No aneurysm visualized.  Other findings: None.  IMPRESSION: Normal.   Electronically Signed   By: Ellery Plunk M.D.   On: 04/22/2015 21:03   I have personally reviewed and evaluated these images and lab results as part of my medical decision-making.   EKG Interpretation None      MDM   Final diagnoses:  CAP (community acquired pneumonia)   patient sent from behavioral health. She is catatonic versus just not responding. Will not take food or medicines orally. Will admit to obs for IV antibiotics.  Benjiman Core, MD 04/24/15 [phone]

## 2015-04-23 NOTE — Progress Notes (Signed)
Pt has been in the bed since this morning, pt will not respond when called or even when shaken. Pt did not have any out put or input. Did not take her PO meds. Pt sent to the [facility] for further evaluation. VS signs stable at the time of transportation but will not responds. Pt transported with the paramedics and a staff.

## 2015-04-23 NOTE — B[facility] Counselor (Signed)
Adult Comprehensive Assessment  Patient ID: Grace Mack, female   DOB: 1965-10-06, 49 y.o.   MRN: [id number]  Information Source: Information source:  (Patient's mother)  Current Stressors:  Educational / Learning stressors: has been taking online courses through [employer] towards [employer] Employment / Job issues: Has been working at [employer] for close to 20 years Family Relationships: Recent divorce from husband of 16 years  Living/Environment/Situation:  Living Arrangements: Children, Parent How long has patient lived in current situation?: Was living alone with 19 YO son after she and husband separated 2 years ago or so.  Just recently went to stay with mother What is atmosphere in current home: Supportive  Family History:  Marital status: Divorced Divorced, when?: Was finalized this year.  Was separated for about 2 years previous to finalization. What types of issues is patient dealing with in the relationship?: Has been stressful adjusting Additional relationship information: Although divorce is finalized, mother states there are still remaining issues about dividing up assets Does patient have children?: Yes How many children?: 1 How is patient's relationship with their children?: 32 YO son.  relationship is solid  Childhood History:  By whom was/is the patient raised?: Both parents Did patient suffer any verbal/emotional/physical/sexual abuse as a child?: No Did patient suffer from severe childhood neglect?: No Has patient ever been sexually abused/assaulted/raped as an adolescent or adult?: No Was the patient ever a victim of a crime or a disaster?: No Witnessed domestic violence?: No Has patient been effected by domestic violence as an adult?: No  Education:  Currently a Consulting civil engineer?: Yes Name of school: [employer] How long has the patient attended?: [employer] Learning disability?: No  Employment/Work Situation:   Employment situation: Employed Where is patient  currently employed?: [employer] How long has patient been employed?: 19 plus years Patient's job has been impacted by current illness: Yes Describe how patient's job has been impacted: Unable to work What is the longest time patient has a held a job?: see above Where was the patient employed at that time?: see above Has patient ever been in the [employer]?: No Has patient ever served in combat?: No  Financial Resources:   Financial resources: Income from employment Does patient have a representative payee or guardian?: No  Alcohol/Substance Abuse:   What has been your use of drugs/alcohol within the last 12 months?: None Alcohol/Substance Abuse Treatment Hx: Denies past history Has alcohol/substance abuse ever caused legal problems?: No  Social Support System:   Patient's Community Support System: Good Describe Community Support System: family Type of faith/religion: Ephriam Knuckles How does patient's faith help to cope with current illness?: "My strength is in the [employer]"  Leisure/Recreation:      Strengths/Needs:      Discharge Plan:   Does patient have access to transportation?: Yes Will patient be returning to same living situation after discharge?: Yes Currently receiving community mental health services: No If no, would patient like referral for services when discharged?: Yes (What county?) Medical sales representative) Does patient have financial barriers related to discharge medications?: No  Summary/Recommendations:   Summary and Recommendations (to be completed by the evaluator): Devota is a 49 YO AA female who is 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  She recently had 2 hospitalizations at [facility] for same, but has no previous mental health hisotry.  She has a long history of work and has been a productive Conservator, museum/gallery.  She recently went through a divorce, and appears to be  in the process of reinventing herself as she has been taking piano lessons, Yoga, and on-line  course towards a masters degree.  A stressor is on-going Audiological scientist with her ex, who appears to be trying to get more money from her as he is not working and applying for disability for himself.  She can benefti from crises stabilization, medication management, therapeutic milieu, and referral for services.  Daryel Gerald B. 04/23/2015

## 2015-04-23 NOTE — ED Notes (Signed)
Pt A&O x4, sitting up on stretcher, calm and cooperative, respirations even and unlabored, skin warm and dry, denies needs at this time. Sitter remains at bedside.

## 2015-04-23 NOTE — Progress Notes (Addendum)
[facility] MD Progress Note  04/23/2015 10:56 AM Grace Mack  MRN:  [id number] Subjective:  Patient seen in bed , appears catatonic , is mute.   Objective;  Grace Mack is a 49 y.o. AA female who as pre initial notes in EHR ,  presented to [place] via her son and mother. Pt had been to 4 hospitals in 4 days per ED notes for medical complaints of chest pain.Pt was medically cleared per previous notes in EHR. Pt has history of Schizoaffective Disorder Depressed Type,per ED notes.  Patient was admitted at [facility] recently - x2 per previous notes in care everywhere . Pt per EHR received Abilify Maintenna IM 400 mg on 04/04/15 . Pt follows up Chaya Jan at [facility] .   Pt today seen in bed. Earlier this AM - pt responded to writer stating ' I will need a few minutes to get up.' But later on was seen as mute , withdrawn , with no eye contact, extreme negativism , resistance . Pt with severe catatonic behavior . Pt per staff - had a restless night last night. Pt had C/O abdominal pain and was send to ED for evaluation last night and was diagnosed with pneumonia ( Chest xray 04/22/15- positive for right lower lobe infiltrate ) - currently being treated for the same.  Pt per staff continues to be depressed, delayed , withdrawn .  Attempted to contact mother - Grace Mack today - left voicemail.     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 Schizoaffective disorder ([place]) [F25.9] 04/19/2015  . Major depressive disorder, recurrent episode ([place]) [F33.9] 04/19/2015  . Decreased potassium in the blood [E87.6] 04/10/2015  . Schizoaffective disorder, depressive type ([place]) [F25.1] 04/06/2015  . Uterus biforus [Q51.3] 01/12/2015  . Essential (primary) hypertension [I10] 12/24/2012  . Anemia, iron deficiency [D50.9] 06/23/2012  . 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  Past Medical History:  Past Medical History  Diagnosis Date  . Hypertension   . Anxiety     Past Surgical History  Procedure Laterality Date  . Cervical biopsy     Family History:  Family History  Problem Relation Age of Onset  . CAD Father    Family Psychiatric  History: per mother - denies any hx .  Social History:  History  Alcohol Use No     History  Drug Use No    Social History   Social History  . Marital Status: Married    Spouse Name: N/A  . Number of Children: N/A  . Years of Education: N/A   Social History Main Topics  . Smoking status: Never Smoker   . Smokeless tobacco: None  . Alcohol Use: No  . Drug Use: No  . Sexual Activity: No   Other Topics Concern  . None   Social History Narrative   Additional Social History: Pt used to work at [employer] , at the same time is going to school doing higher education. She recently got divorced - in march 2016. Has a 3 y old son.   Sleep: was restless per staff  Appetite:  needs a lot of encouragement per staff  Current Medications: Current Facility-Administered Medications  Medication Dose  Route Frequency Provider Last Rate Last Dose  . acetaminophen (TYLENOL) tablet 650 mg  650 mg Oral Q6H PRN Harriet Butte, NP   650 mg at 04/20/15 1900  . alum & mag hydroxide-simeth (MAALOX/MYLANTA) 200-200-20 MG/5ML suspension 30 mL  30 mL Oral Q4H PRN Harriet Butte, NP      . Derrill Memo ON 05/03/2015] ARIPiprazole SUSR 400 mg  400 mg Intramuscular Q30 days Ursula Alert, MD      . Derrill Memo ON 04/24/2015] azithromycin (ZITHROMAX) tablet 250 mg  250 mg Oral Daily Laverle Hobby, PA-C      . benztropine (COGENTIN) tablet 0.5 mg  0.5 mg Oral QHS Ursula Alert, MD   0.5 mg at 04/22/15 2146  . docusate sodium (COLACE) capsule 100 mg  100 mg Oral BID  Ursula Alert, MD   100 mg at 04/23/15 0954  . feeding supplement (ENSURE ENLIVE) (ENSURE ENLIVE) liquid 237 mL  237 mL Oral TID BM Elonda Husky, RD   Stopped at 04/22/15 2110  . ferrous sulfate tablet 325 mg  325 mg Oral Q breakfast Ursula Alert, MD   325 mg at 04/23/15 0954  . hydrochlorothiazide (MICROZIDE) capsule 12.5 mg  12.5 mg Oral Daily Ursula Alert, MD   12.5 mg at 04/23/15 0954  . ibuprofen (ADVIL,MOTRIN) tablet 600 mg  600 mg Oral Q6H PRN Laverle Hobby, PA-C      . loratadine (CLARITIN) tablet 10 mg  10 mg Oral Daily Jenne Campus, MD   10 mg at 04/23/15 0954  . LORazepam (ATIVAN) tablet 1 mg  1 mg Oral BID Sisto Granillo, MD      . magnesium hydroxide (MILK OF MAGNESIA) suspension 30 mL  30 mL Oral Daily PRN Harriet Butte, NP      . megestrol (MEGACE) 400 MG/10ML suspension 400 mg  400 mg Oral Daily Junie Avilla, MD      . QUEtiapine (SEROQUEL) tablet 25 mg  25 mg Oral QHS Ursula Alert, MD   25 mg at 04/22/15 2150  . venlafaxine XR (EFFEXOR-XR) 24 hr capsule 37.5 mg  37.5 mg Oral Q breakfast Ursula Alert, MD   37.5 mg at 04/23/15 8127    Lab Results:  Results for orders placed or performed during the hospital encounter of 04/19/15 (from the past 48 hour(s))  CBC with Differential/Platelet     Status: Abnormal   Collection Time: 04/22/15  8:05 PM  Result Value Ref Range   WBC 9.1 4.0 - 10.5 K/uL   RBC 4.40 3.87 - 5.11 MIL/uL   Hemoglobin 11.7 (L) 12.0 - 15.0 g/dL   HCT 34.1 (L) 36.0 - 46.0 %   MCV 77.5 (L) 78.0 - 100.0 fL   MCH 26.6 26.0 - 34.0 pg   MCHC 34.3 30.0 - 36.0 g/dL   RDW 17.1 (H) 11.5 - 15.5 %   Platelets 224 150 - 400 K/uL   Neutrophils Relative % 74 %   Neutro Abs 6.7 1.7 - 7.7 K/uL   Lymphocytes Relative 13 %   Lymphs Abs 1.1 0.7 - 4.0 K/uL   Monocytes Relative 13 %   Monocytes Absolute 1.2 (H) 0.1 - 1.0 K/uL   Eosinophils Relative 1 %   Eosinophils Absolute 0.1 0.0 - 0.7 K/uL   Basophils Relative 0 %   Basophils Absolute 0.0 0.0 -  0.1 K/uL  Comprehensive metabolic panel     Status: Abnormal   Collection Time: 04/22/15  8:05 PM  Result Value Ref Range  Sodium 135 135 - 145 mmol/L   Potassium 3.7 3.5 - 5.1 mmol/L   Chloride 102 101 - 111 mmol/L   CO2 25 22 - 32 mmol/L   Glucose, Bld 90 65 - 99 mg/dL   BUN 14 6 - 20 mg/dL   Creatinine, Ser 7.23 0.44 - 1.00 mg/dL   Calcium 9.3 8.9 - 75.3 mg/dL   Total Protein 8.2 (H) 6.5 - 8.1 g/dL   Albumin 3.8 3.5 - 5.0 g/dL   AST 23 15 - 41 U/L   ALT 21 14 - 54 U/L   Alkaline Phosphatase 96 38 - 126 U/L   Total Bilirubin 1.0 0.3 - 1.2 mg/dL   GFR calc non Af Amer >60 >60 mL/min   GFR calc Af Amer >60 >60 mL/min    Comment: (NOTE) The eGFR has been calculated using the CKD EPI equation. This calculation has not been validated in all clinical situations. eGFR's persistently <60 mL/min signify possible Chronic Kidney Disease.    Anion gap 8 5 - 15  Lipase, blood     Status: None   Collection Time: 04/22/15  8:05 PM  Result Value Ref Range   Lipase 27 22 - 51 U/L  Urinalysis, Routine w reflex microscopic (not at [facility])     Status: Abnormal   Collection Time: 04/22/15  8:27 PM  Result Value Ref Range   Color, Urine YELLOW YELLOW   APPearance CLEAR CLEAR   Specific Gravity, Urine 1.014 1.005 - 1.030   pH 6.0 5.0 - 8.0   Glucose, UA NEGATIVE NEGATIVE mg/dL   Hgb urine dipstick SMALL (A) NEGATIVE   Bilirubin Urine NEGATIVE NEGATIVE   Ketones, ur NEGATIVE NEGATIVE mg/dL   Protein, ur NEGATIVE NEGATIVE mg/dL   Urobilinogen, UA 1.0 0.0 - 1.0 mg/dL   Nitrite NEGATIVE NEGATIVE   Leukocytes, UA TRACE (A) NEGATIVE  Urine microscopic-add on     Status: Abnormal   Collection Time: 04/22/15  8:27 PM  Result Value Ref Range   Squamous Epithelial / LPF FEW (A) RARE   WBC, UA 0-2 <3 WBC/hpf   Bacteria, UA RARE RARE   Urine-Other MUCOUS PRESENT     Physical Findings: AIMS: Facial and Oral Movements Muscles of Facial Expression: None, normal Lips and Perioral Area: None,  normal Jaw: None, normal Tongue: None, normal,Extremity Movements Upper (arms, wrists, hands, fingers): None, normal Lower (legs, knees, ankles, toes): None, normal, Trunk Movements Neck, shoulders, hips: None, normal, Overall Severity Severity of abnormal movements (highest score from questions above): None, normal Incapacitation due to abnormal movements: None, normal Patient's awareness of abnormal movements (rate only patient's report): No Awareness, Dental Status Current problems with teeth and/or dentures?: No Does patient usually wear dentures?: No  CIWA:    COWS:     Musculoskeletal: Strength & Muscle Tone: resistant, extreme negativism Gait & Station: normal Patient leans: N/A  Psychiatric Specialty Exam: Review of Systems  Unable to perform ROS: mental acuity    Blood pressure 118/64, pulse 86, temperature 98.3 F (36.8 C), temperature source Oral, resp. rate 20, height 5' 3.78" (1.62 m), weight 71.668 kg (158 lb), last menstrual period 04/07/2015, SpO2 96 %.Body mass index is 27.31 kg/(m^2).  General Appearance: Disheveled  Eye Contact::  Minimal  Speech:  pt is mute today   Volume:  Decreased  Mood:  does not respond  Affect:  Constricted  Thought Process:  unable to assess - however linear per nursing report  Orientation:  Other:  unable to assess  Thought Content:  Delusions, Paranoid Ideation and Rumination  Suicidal Thoughts:  did not express any  Homicidal Thoughts:  did not express any  Memory:  Immediate;   unable to assess Recent;   unable to assess Remote;   unable to assess  Judgement:  Impaired  Insight:  Lacking  Psychomotor Activity:  Decreased and Psychomotor Retardation  Concentration:  Poor  Recall:  Fair  Fund of Knowledge:Good  Language: Fair  Akathisia:  No  Handed:  Right  AIMS (if indicated):     Assets:  Financial Resources/Insurance Housing Social Support Vocational/Educational  ADL's:  Impaired  Cognition: Impaired,  Mild   Sleep:  Number of Hours: 3.25     Collateral information:  Also per EHR - Per her mother Grace Mack [phone] - Patient's mother confirmed that patient is currently employed in the billing department at [employer] and states the patient has had no mental health problems prior to her first admission to [facility]. Grace Mack feels her daughter's current state of mind is the result of the extremely stress from work, motherhood, recent divorce (March 2016) and trying to complete a master's degree. Mother denied any previous family history of mental problems.      Assesement: Patient presented with worsening depression ,today appears to be mute, withdrawn , with resistance and negativism . Pt per staff did respond appropriately to questions today AM, but speech is limited, brief. Pt needs a lot of encouragement to take medications and food. Pt with no past hx of mental illness , other than her most recent admissions a month ago at [facility]. Pt was diagnosed with Pneumonia yesterday - as per ED evaluation, currently on antibiotics for the same. Will continue support and treatment.    Treatment Plan Summary: Daily contact with patient to assess and evaluate symptoms and progress in treatment and Medication management   Will continue Seroquel 25 mg po qhs to augment the effect of Abilify IM 400 mg - first dose received on 04/04/15. Will continue Cogentin to 0.5 mg po qhs for EPS. Will continue  Effexor XR 37.5 mg po daily for affective sx. Will give Ativan 1 mg IM x 1 dose for catatonia. Will also schedule Ativan 1 mg po bid , a few doses and monitor response. Will continue Ferrous sulphate 325 mg po daily for chronic anemia. Add Ensure po tid . Add Megace 400 mg po daily for appetite increase. Pt had multiple work up done in ED as well as during her most recent hospitalization at [facility]- ACS work-up in ED unremarkable.  Normal CT Angio on 04/01/15 . Previous cardiac enzymes over last 2 weeks all normal.  Pt  also had a CT head w/o contrast done ON 04/01/15- Negative for any acute pathology. Pt had Chest xray - 04/19/15- which was negative. However chest xray done - 04/22/15- positive for right lower lobe infiltrate - currently being treated for the same.Patient is currently on Azythromycin + Ceftriaxone. Pt had abdominal USG done on 04/22/15- negative for any acute pathology.   Reviewed past medical records,treatment plan.  Will continue to monitor vitals ,medication compliance and treatment side effects while patient is here.  Will monitor for medical issues as well as call consult as needed.  Reviewed labs TSH , lipid panel ,hba1c- wnl, ekg- wnl. CSW will start working on disposition.  Patient to participate in therapeutic milieu .            Deaundre Allston MD. 04/23/2015, 10:56 AM

## 2015-04-23 NOTE — H&P (Signed)
[facility] History and Physical  Grace Mack ZHY:[id number] DOB: [birth year] DOA: 04/23/2015  Referring physician: er PCP: No primary care provider on file.   Chief Complaint:   HPI: Grace Mack is a 49 y.o. female  Who was at [facility], admitted on 10/1.  She was sent to the ER on 10/3 for abd pain.   U/A was negative.  SHe was sent to the ER AGAIN on 10/4 as they said she was catatonic.  She was refusing to take her abx and other meds.  She was given 2 doses of ativan at [facility].    She had a normal CT Angio on 04/01/15.  Pt also had a CT head w/o contrast done ON 04/01/15- Negative for any acute pathology. Pt had Chest xray - 04/19/15- which was negative. However chest xray done - 04/22/15- positive for right lower lobe infiltrate -   In the ER, patient would say "help me".  When asked in her stomach hurt, she said "yes".    hopsitalist were asked to treat for PNA   Review of Systems:  All systems reviewed, negative unless stated above    Past Medical History  Diagnosis Date  . Hypertension   . Anxiety    Past Surgical History  Procedure Laterality Date  . Cervical biopsy     Social History:  reports that she has never smoked. She does not have any smokeless tobacco history on file. She reports that she does not drink alcohol or use illicit drugs.  No Known Allergies  Family History  Problem Relation Age of Onset  . CAD Father     Prior to Admission medications   Medication Sig Start Date End Date Taking? Authorizing Provider  ferrous sulfate 325 (65 FE) MG tablet Take 325 mg by mouth daily.   Yes Historical Provider, MD  hydrochlorothiazide (HYDRODIURIL) 25 MG tablet Take 12.5 mg by mouth daily.   Yes Historical Provider, MD  risperiDONE (RISPERDAL M-TABS) 2 MG disintegrating tablet Take 2 mg by mouth 2 (two) times daily. 04/09/15 05/09/15 Yes Historical Provider, MD  azithromycin (ZITHROMAX Z-PAK) 250 MG tablet Take 1 tablet (250 mg total) by mouth daily.  PO day  1, then  PO days 205 04/22/15   Eber Hong, MD  ibuprofen (ADVIL,MOTRIN) 800 MG tablet Take 1 tablet (800 mg total) by mouth 3 (three) times daily. 04/22/15   Eber Hong, MD   Physical Exam: Filed Vitals:   04/23/15 1822  BP: 125/77  Pulse: 84  Temp: 98.5 F (36.9 C)  TempSrc: Oral  Resp: 18  SpO2: 100%    Wt Readings from Last 3 Encounters:  04/19/15 71.668 kg (158 lb)  04/19/15 72.576 kg (160 lb)  04/19/15 72.576 kg (160 lb)    General:  Laying in bed, eyes closed, will say a few words Eyes: PERRL, normal lids, irises & conjunctiva ENT: grossly normal hearing, lips & tongue Neck: no LAD, masses or thyromegaly Cardiovascular: RRR, no m/r/g. No LE edema. Telemetry: SR, no arrhythmias  Respiratory: CTA anteriorly, no w/r/r. Normal respiratory effort. Abdomen: soft, ntnd Skin: no rash or induration seen on limited exam- in hallway Musculoskeletal: grossly normal tone BUE/BLE Psychiatric: flat, will say a few words           Labs on Admission:  Basic Metabolic Panel:  Recent Labs Lab 04/19/15 0206 04/19/15 1423 04/22/15 2005  NA 136 136 135  K 4.1 4.0 3.7  CL 103 102 102  CO2 26 25 25  GLUCOSE 91 90 90  BUN CREATININE 0.73 0.75 0.76  CALCIUM 9.0 9.1 9.3   Liver Function Tests:  Recent Labs Lab 04/19/15 1423 04/22/15 2005  AST 27 23  ALT 24 21  ALKPHOS 95 96  BILITOT 0.7 1.0  PROT 8.3* 8.2*  ALBUMIN 4.0 3.8    Recent Labs Lab 04/22/15 2005  LIPASE 27   No results for input(s): AMMONIA in the last 168 hours. CBC:  Recent Labs Lab 04/19/15 0206 04/19/15 1423 04/22/15 2005  WBC 7.7 8.0 9.1  NEUTROABS  --  5.8 6.7  HGB 11.5* 12.4 11.7*  HCT 34.1* 35.8* 34.1*  MCV 78.4 76.7* 77.5*  PLT 164 164 224   Cardiac Enzymes:  Recent Labs Lab 04/19/15 1423 04/19/15 1615  TROPONINI 0.04* 0.04*    BNP (last 3 results) No results for input(s): BNP in the last 8760 hours.  ProBNP (last 3 results) No results for input(s):  PROBNP in the last 8760 hours.  CBG:  Recent Labs Lab 04/21/15 0232  GLUCAP 101*    Radiological Exams on Admission: Dg Chest 2 View  04/22/2015   CLINICAL DATA:  Intermittent right upper quadrant pain.  EXAM: CHEST  2 VIEW  COMPARISON:  04/19/2015  FINDINGS: There are patchy airspace opacities in the right middle lobe and right lower lobe, suspicious for pneumonia. Minimal linear opacity is present in the left base which may represent scarring or atelectasis. There is no pleural effusion. Pulmonary vasculature is normal. Hilar and mediastinal contours are unremarkable. Heart size is normal and unchanged.  IMPRESSION: Right middle lobe and right lower lobe infiltrates, suspicious for pneumonia. Followup PA and lateral chest X-ray is recommended in 3-4 weeks following trial of antibiotic therapy to ensure resolution and exclude underlying malignancy.   Electronically Signed   By: Ellery Plunk M.D.   On: 04/22/2015 21:11   US Abdomen Complete  04/22/2015   CLINICAL DATA:  Right upper quadrant abdominal pain for 1 day.  EXAM: ULTRASOUND ABDOMEN COMPLETE  COMPARISON:  None.  FINDINGS: Gallbladder: No gallstones or wall thickening visualized. No sonographic Murphy sign noted.  Common bile duct: Diameter: 2.9 mm  Liver: No focal lesion identified. Within normal limits in parenchymal echogenicity.  IVC: No abnormality visualized.  Pancreas: Visualized portion unremarkable.  Spleen: Size and appearance within normal limits.  Right Kidney: Length: 10.4 cm. Echogenicity within normal limits. No mass or hydronephrosis visualized.  Left Kidney: Length: 9.6 cm. Echogenicity within normal limits. No mass or hydronephrosis visualized.  Abdominal aorta: No aneurysm visualized.  Other findings: None.  IMPRESSION: Normal.   Electronically Signed   By: Ellery Plunk M.D.   On: 04/22/2015 21:03      Assessment/Plan Active Problems:   * No active hospital problems. *   Pneumonia -IV levaquin for now-  change to PO when able to take ? Aspiration-- SLP eveal  Catatonia/MDD -spoke with me in ER -defer to psych who I called and consulted at [facility] on 10/4  Abdominal pain -recent U/S ok Lipase in AM -liver enzymes ok  Psych to see  Code Status: full DVT Prophylaxis: Family Communication: patient Disposition Plan:   Time spent: 35 min  Marlin Canary [facility] Pager [phone]

## 2015-04-24 DIAGNOSIS — I10 Essential (primary) hypertension: Secondary | ICD-10-CM | POA: Diagnosis not present

## 2015-04-24 DIAGNOSIS — J189 Pneumonia, unspecified organism: Secondary | ICD-10-CM | POA: Diagnosis present

## 2015-04-24 DIAGNOSIS — F258 Other schizoaffective disorders: Secondary | ICD-10-CM | POA: Diagnosis not present

## 2015-04-24 DIAGNOSIS — F259 Schizoaffective disorder, unspecified: Secondary | ICD-10-CM | POA: Diagnosis not present

## 2015-04-24 DIAGNOSIS — F061 Catatonic disorder due to known physiological condition: Secondary | ICD-10-CM | POA: Diagnosis not present

## 2015-04-24 LAB — CBC
HEMATOCRIT: 33.2 % — AB (ref 36.0–46.0)
HEMOGLOBIN: 11 g/dL — AB (ref 12.0–15.0)
MCH: 26 pg (ref 26.0–34.0)
MCHC: 33.1 g/dL (ref 30.0–36.0)
MCV: 78.5 fL (ref 78.0–100.0)
Platelets: 233 10*3/uL (ref 150–400)
RBC: 4.23 MIL/uL (ref 3.87–5.11)
RDW: 17.3 % — ABNORMAL HIGH (ref 11.5–15.5)
WBC: 6.9 10*3/uL (ref 4.0–10.5)

## 2015-04-24 LAB — BASIC METABOLIC PANEL
ANION GAP: 9 (ref 5–15)
BUN: 11 mg/dL (ref 6–20)
CHLORIDE: 104 mmol/L (ref 101–111)
CO2: 23 mmol/L (ref 22–32)
Calcium: 8.9 mg/dL (ref 8.9–10.3)
Creatinine, Ser: 0.71 mg/dL (ref 0.44–1.00)
GFR calc Af Amer: >60 mL/min (ref >60)
Glucose, Bld: 78 mg/dL (ref 65–99)
POTASSIUM: 3.8 mmol/L (ref 3.5–5.1)
SODIUM: 136 mmol/L (ref 135–145)

## 2015-04-24 LAB — HIV ANTIBODY (ROUTINE TESTING W REFLEX): HIV Screen 4th Generation wRfx: NONREACTIVE

## 2015-04-24 LAB — LIPASE, BLOOD: Lipase: 22 U/L (ref 22–51)

## 2015-04-24 LAB — STREP PNEUMONIAE URINARY ANTIGEN: STREP PNEUMO URINARY ANTIGEN: NEGATIVE

## 2015-04-24 MED ORDER — VENLAFAXINE HCL ER 75 MG PO CP24
75.0000 mg | ORAL_CAPSULE | Freq: Every day | ORAL | Status: DC
  Administered 2015-04-25: 75 mg via ORAL
  Filled 2015-04-24 (×2): qty 1

## 2015-04-24 MED ORDER — LEVOFLOXACIN 750 MG PO TABS
750.0000 mg | ORAL_TABLET | ORAL | Status: DC
  Filled 2015-04-24 (×2): qty 1

## 2015-04-24 MED ORDER — RISPERIDONE 2 MG PO TBDP
2.0000 mg | ORAL_TABLET | Freq: Two times a day (BID) | ORAL | Status: DC
  Administered 2015-04-25: 2 mg via ORAL
  Filled 2015-04-24 (×4): qty 1

## 2015-04-24 NOTE — Progress Notes (Signed)
Progress Note   Grace Mack MVH:[id number] DOB: [birth year] DOA: 04/23/2015 PCP: No primary care provider on file.   Brief Narrative:   CHANAE GEMMA is an 49 y.o. female the PMH of schizoaffective disorder who was initially admitted to [facility] under IVC 04/20/15 with behavioral changes. She was sent to the ED on 04/22/15 for evaluation of abdominal pain, and was diagnosed with CAP ultimately discharged back to [facility] after being given an IM dose of Rocephin, with recommendations for oral antibiotics. 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  Assessment/Plan:   Principal Problem:   CAP (community acquired pneumonia) rule out aspiration pneumonia - Continue IV Levaquin. - Speech therapy evaluation to evaluate for dysphasia. Maybe functional given catatonia.  Active Problems:   H/O HTN - HCTZ on hold.  BP low.    Abdominal pain - Status post recent ultrasound which was unremarkable. LFTs WNL. Lipase WNL. - Given pain in RUQ, suspect this is referred pain from her right-sided pneumonia.    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 Risperdal and Effexor resumed.    DVT Prophylaxis - Lovenox ordered.  Family Communication: No family at the bedside. Disposition Plan: Home when stable.  Does not meet criteria for inpatient psychiatric hospitalization. Code Status:     Code Status Orders        Start     Ordered   04/23/15 1833  Full code   Continuous     04/23/15 1832      IV Access:    Peripheral IV   Procedures and diagnostic studies:   Dg Chest 2 View  04/22/2015   CLINICAL DATA:  Intermittent right upper quadrant pain.  EXAM: CHEST  2 VIEW  COMPARISON:  04/19/2015  FINDINGS: There are patchy airspace opacities in the right middle lobe and right lower lobe, suspicious for pneumonia. Minimal linear opacity is  present in the left base which may represent scarring or atelectasis. There is no pleural effusion. Pulmonary vasculature is normal. Hilar and mediastinal contours are unremarkable. Heart size is normal and unchanged.  IMPRESSION: Right middle lobe and right lower lobe infiltrates, suspicious for pneumonia. Followup PA and lateral chest X-ray is recommended in 3-4 weeks following trial of antibiotic therapy to ensure resolution and exclude underlying malignancy.   Electronically Signed   By: Ellery Plunk M.D.   On: 04/22/2015 21:11   US Abdomen Complete  04/22/2015   CLINICAL DATA:  Right upper quadrant abdominal pain for 1 day.  EXAM: ULTRASOUND ABDOMEN COMPLETE  COMPARISON:  None.  FINDINGS: Gallbladder: No gallstones or wall thickening visualized. No sonographic Murphy sign noted.  Common bile duct: Diameter: 2.9 mm  Liver: No focal lesion identified. Within normal limits in parenchymal echogenicity.  IVC: No abnormality visualized.  Pancreas: Visualized portion unremarkable.  Spleen: Size and appearance within normal limits.  Right Kidney: Length: 10.4 cm. Echogenicity within normal limits. No mass or hydronephrosis visualized.  Left Kidney: Length: 9.6 cm. Echogenicity within normal limits. No mass or hydronephrosis visualized.  Abdominal aorta: No aneurysm visualized.  Other findings: None.  IMPRESSION: Normal.   Electronically Signed   By: Ellery Plunk M.D.   On: 04/22/2015 21:03     Medical Consultants:    Psychiatry: Leata Mouse, MD  Anti-Infectives:   Anti-infectives    Start     Dose/Rate Route Frequency Ordered Stop  04/23/15 2000  levofloxacin (LEVAQUIN) IVPB 750 mg     750 mg 100 mL/hr over 90 Minutes Intravenous Daily at bedtime 04/23/15 1832 04/28/15 2159      Subjective:   Grace Mack denies dyspnea, cough.  She has RUQ abdominal pain.  Appetite improving.  Some nausea, no vomiting.  Objective:    Filed Vitals:   04/23/15 1822 04/23/15 2106  04/24/15 0511  BP: 125/77 129/73 117/63  Pulse: 84 96 89  Temp: 98.5 F (36.9 C) 98.4 F (36.9 C) 99.1 F (37.3 C)  TempSrc: Oral Oral Oral  Resp: SpO2: 100% 98% 98%   No intake or output data in the 24 hours ending 04/24/15 0818 There were no vitals filed for this visit.  Exam: Gen/Psych:  NAD, flat affect Cardiovascular:  RRR, No M/R/G Respiratory:  Lungs CTAB Gastrointestinal:  Abdomen soft, minimal tenderness RUQ, + BS Extremities:  No C/E/C   Data Reviewed:    Labs: Basic Metabolic Panel:  Recent Labs Lab 04/19/15 0206 04/19/15 1423 04/22/15 2005 04/24/15 0544  NA 136 136 135 136  K 4.1 4.0 3.7 3.8  CL 103 102 102 104  CO2 GLUCOSE 91 90 90 78  BUN CREATININE 0.73 0.75 0.76 0.71  CALCIUM 9.0 9.1 9.3 8.9   GFR Estimated Creatinine Clearance: 84 mL/min (by C-G formula based on Cr of 0.71). Liver Function Tests:  Recent Labs Lab 04/19/15 1423 04/22/15 2005  AST 27 23  ALT 24 21  ALKPHOS 95 96  BILITOT 0.7 1.0  PROT 8.3* 8.2*  ALBUMIN 4.0 3.8    Recent Labs Lab 04/22/15 2005 04/24/15 0544  LIPASE 27 22   CBC:  Recent Labs Lab 04/19/15 0206 04/19/15 1423 04/22/15 2005 04/24/15 0544  WBC 7.7 8.0 9.1 6.9  NEUTROABS  --  5.8 6.7  --   HGB 11.5* 12.4 11.7* 11.0*  HCT 34.1* 35.8* 34.1* 33.2*  MCV 78.4 76.7* 77.5* 78.5  PLT 164 164 224 233   Cardiac Enzymes:  Recent Labs Lab 04/19/15 1423 04/19/15 1615  TROPONINI 0.04* 0.04*   CBG:  Recent Labs Lab 04/21/15 0232  GLUCAP 101*    Microbiology No results found for this or any previous visit (from the past 240 hour(s)).   Medications:   . enoxaparin (LOVENOX) injection  40 mg Subcutaneous Q24H  . Influenza vac split quadrivalent PF  0.5 mL Intramuscular Tomorrow-1000  . levofloxacin (LEVAQUIN) IV  750 mg Intravenous QHS   Continuous Infusions:   Time spent: 25 minutes.     RAMA,CHRISTINA  [facility] Pager [phone]. If  unable to reach me by pager, please call my cell phone at [phone].  *Please refer to [email or web address], password [email or web address] to get updated schedule on who will round on this patient, as hospitalists switch teams weekly. If 7PM-7AM, please contact night-coverage at [email or web address], password [email or web address] for any overnight needs.  04/24/2015, 8:18 AM

## 2015-04-24 NOTE — Progress Notes (Signed)
PHARMACIST - PHYSICIAN COMMUNICATION DR:   Rama CONCERNING: Antibiotic IV to Oral Route Change Policy  RECOMMENDATION: This patient is receiving Levaquin by the intravenous route.  Based on criteria approved by the Pharmacy and Therapeutics Committee, the antibiotic(s) is/are being converted to the equivalent oral dose form(s).   DESCRIPTION: These criteria include:  Patient being treated for a respiratory tract infection, urinary tract infection, cellulitis or clostridium difficile associated diarrhea if on metronidazole  The patient is not neutropenic and does not exhibit a GI malabsorption state  The patient is eating (either orally or via tube) and/or has been taking other orally administered medications for a least 24 hours  The patient is improving clinically and has a Tmax < 100.5  If you have questions about this conversion, please contact the Pharmacy Department    [phone] )  Jeani Hawking   [phone] )  [facility]   [phone] )  [facility]   [phone] )  [facility]   [phone] )  [facility]   Junita Push, PharmD, BCPS Pager: [phone] 04/24/2015@9 :58 AM

## 2015-04-24 NOTE — Consult Note (Signed)
[place] Psychiatry Consult   Reason for Consult:  Schizophrenia and history of noncompliance Referring Physician:  Dr. Rockne Menghini Patient Identification: Grace Mack MRN:  [id number] Principal Diagnosis: Schizoaffective disorder [facility]) Diagnosis:   Patient Active Problem List   Diagnosis Date Noted  . CAP (community acquired pneumonia) [J18.9] 04/24/2015  . Catatonia ([place]) [F06.1] 04/22/2015  . Schizoaffective disorder ([place]) [F25.9] 04/19/2015  . Major depressive disorder, recurrent episode ([place]) [F33.9] 04/19/2015  . Decreased potassium in the blood [E87.6] 04/10/2015  . Schizoaffective disorder, depressive type ([place]) [F25.1] 04/06/2015  . Uterus biforus [Q51.3] 01/12/2015  . Essential (primary) hypertension [I10] 12/24/2012  . Anemia, iron deficiency [D50.9] 06/23/2012  . 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 female patient admitted with auditory hallucinations and catatonia.  HPI:  Grace Mack is a 49 y.o. female seen, chart reviewed for face-to-face psychiatric consultation and evaluation of schizoaffective disorder and history of noncompliance with medication management. Patient was initially admitted to behavioral health Hospital on October 1 and sent to [facility] long emergency department for abdominal pain and also has pneumonia. Patient reportedly become more physically sick and unable to participate in program respond appropriately. Reportedly patient appeared to be catatonic at that time required benzodiazepine therapy. Patient complaining abdomen flank pain when moves around and also has sleep disturbance. Patient has no afferent symptoms of depression, anxiety, auditory/visual hallucinations, delusions and paranoia the patient denies current active suicidal/homicidal ideation, intention or plans. Patient has been compliant with her medication management and  reportedly no side effects. Patient is hoping to be discharged to home when medically stable.   Past Psychiatric History: Patient has been suffering with the chronic schizoaffective disorder, depression, partially compliant with her medication management and has multiple acute psychiatric hospitalization in [facility].  Risk to Self: Is patient at risk for suicide?: No Risk to Others:   Prior Inpatient Therapy:   Prior Outpatient Therapy:    Past Medical History:  Past Medical History  Diagnosis Date  . Hypertension   . Anxiety     Past Surgical History  Procedure Laterality Date  . Cervical biopsy     Family History:  Family History  Problem Relation Age of Onset  . CAD Father    Family Psychiatric  History: unknown Social History:  History  Alcohol Use No     History  Drug Use No    Social History   Social History  . Marital Status: Married    Spouse Name: N/A  . Number of Children: N/A  . Years of Education: N/A   Social History Main Topics  . Smoking status: Never Smoker   . Smokeless tobacco: Not on file  . Alcohol Use: No  . Drug Use: No  . Sexual Activity: No   Other Topics Concern  . Not on file   Social History Narrative   Additional Social History:                          Allergies:  No Known Allergies  Labs:  Results for orders placed or performed during the hospital encounter of 04/23/15 (from the past 48 hour(s))  Basic metabolic panel     Status: None   Collection Time: 04/24/15  5:44 AM  Result Value Ref Range   Sodium 136 135 - 145 mmol/L  Potassium 3.8 3.5 - 5.1 mmol/L   Chloride 104 101 - 111 mmol/L   CO2 23 22 - 32 mmol/L   Glucose, Bld 78 65 - 99 mg/dL   BUN 11 6 - 20 mg/dL   Creatinine, Ser 0.71 0.44 - 1.00 mg/dL   Calcium 8.9 8.9 - 10.3 mg/dL   GFR calc non Af Amer >60 >60 mL/min   GFR calc Af Amer >60 >60 mL/min    Comment: (NOTE) The eGFR has been calculated using the CKD EPI equation. This  calculation has not been validated in all clinical situations. eGFR's persistently <60 mL/min signify possible Chronic Kidney Disease.    Anion gap 9 5 - 15  CBC     Status: Abnormal   Collection Time: 04/24/15  5:44 AM  Result Value Ref Range   WBC 6.9 4.0 - 10.5 K/uL   RBC 4.23 3.87 - 5.11 MIL/uL   Hemoglobin 11.0 (L) 12.0 - 15.0 g/dL   HCT 33.2 (L) 36.0 - 46.0 %   MCV 78.5 78.0 - 100.0 fL   MCH 26.0 26.0 - 34.0 pg   MCHC 33.1 30.0 - 36.0 g/dL   RDW 17.3 (H) 11.5 - 15.5 %   Platelets 233 150 - 400 K/uL    Comment: REPEATED TO VERIFY  Lipase, blood     Status: None   Collection Time: 04/24/15  5:44 AM  Result Value Ref Range   Lipase 22 22 - 51 U/L    Current Facility-Administered Medications  Medication Dose Route Frequency Provider Last Rate Last Dose  . enoxaparin (LOVENOX) injection 40 mg  40 mg Subcutaneous Q24H Jessica U Vann, DO   40 mg at 04/23/15 2200  . Influenza vac split quadrivalent PF (FLUARIX) injection 0.5 mL  0.5 mL Intramuscular Tomorrow-1000 Jessica U Vann, DO      . levofloxacin (LEVAQUIN) IVPB 750 mg  750 mg Intravenous QHS Geradine Girt, DO   750 mg at 04/23/15 1939    Musculoskeletal: Strength & Muscle Tone: decreased Gait & Station: unable to stand Patient leans: N/A  Psychiatric Specialty Exam: ROS abdominal pain, denied nausea, vomiting, chest pain and shortness of breath No Fever-chills, No Headache, No changes with Vision or hearing, reports vertigo No problems swallowing food or Liquids, No Chest pain, Cough or Shortness of Breath, No Abdominal pain, No Nausea or Vommitting, Bowel movements are regular, No Blood in stool or Urine, No dysuria, No new skin rashes or bruises, No new joints pains-aches,  No new weakness, tingling, numbness in any extremity, No recent weight gain or loss, No polyuria, polydypsia or polyphagia,   A full 10 point Review of Systems was done, except as stated above, all other Review of Systems were negative.   Blood pressure 117/63, pulse 89, temperature 99.1 F (37.3 C), temperature source Oral, resp. rate 20, last menstrual period 04/07/2015, SpO2 98 %.There is no weight on file to calculate BMI.  General Appearance: Guarded  Eye Contact::  Good  Speech:  Slow  Volume:  Decreased  Mood:  Anxious and Depressed  Affect:  Constricted and Depressed  Thought Process:  Coherent and Goal Directed  Orientation:  Full (Time, Place, and Person)  Thought Content:  WDL  Suicidal Thoughts:  No  Homicidal Thoughts:  No  Memory:  Immediate;   Good Recent;   Good  Judgement:  Intact  Insight:  Shallow  Psychomotor Activity:  Decreased  Concentration:  Fair  Recall:  Good  Fund of Knowledge:Good  Language:  Good  Akathisia:  Negative  Handed:  Right  AIMS (if indicated):     Assets:  Communication Skills Desire for Improvement Housing Leisure Time Resilience Social Support Talents/Skills Transportation  ADL's:  Intact  Cognition: WNL  Sleep:      Treatment Plan Summary: Daily contact with patient to assess and evaluate symptoms and progress in treatment and Medication management  Disposition: Patient has no safety concerns Restart psychiatric medications including Risperidal M-tab tablet 2 mg twice daily for auditory hallucinations and psychosis, Effexor XL 75 mg daily for depression Patient does not meet criteria for psychiatric inpatient admission. Supportive therapy provided about ongoing stressors.  Patient will be referred to the outpatient medication management and medically stable Appreciate psychiatric consultation and follow up as clinically required Please contact [phone] or [phone] if needs further assistance  Jaris Kohles,JANARDHAHA R. 04/24/2015 9:09 AM

## 2015-04-24 NOTE — Evaluation (Signed)
Clinical/Bedside Swallow Evaluation Patient Details  Name: SAMYAH BILBO MRN: [id number] Date of Birth: 1966/04/10  Today's Date: 04/24/2015 Time: SLP Start Time (ACUTE ONLY): 0915 SLP Stop Time (ACUTE ONLY): 0937 SLP Time Calculation (min) (ACUTE ONLY): 22 min  Past Medical History:  Past Medical History  Diagnosis Date  . Hypertension   . Anxiety    Past Surgical History:  Past Surgical History  Procedure Laterality Date  . Cervical biopsy     HPI:  49 yo female adm to [facility] from behavioral health with abdominal pain - has h/o schizoaffective disorder.  PMH + for HTN, anxiety.  She had a low grade fever and was found to have right middle and lower lobe pneumonia.  Swallow evaluation ordered.     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  No s/s of aspiration with po observed and swallow was timely with clear voice throughout.   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  SLP educated her to alternative ways to take medication - including taking large pills with foods to aid pharyngeal transiting.  Pt and her cousin Efraim Kaufmann reported understanding to information reviewed.         Aspiration Risk       Diet Recommendation Age appropriate regular solids;Thin   Medication Administration: Whole meds with liquid Compensations: Slow rate;Small sips/bites    Other  Recommendations Oral Care Recommendations: Oral care BID   Follow Up Recommendations       Frequency and Duration        Pertinent Vitals/Pain Low grade fever, decreased     Swallow Study Prior Functional Status   see hhx    General Date of Onset: 04/24/15 Other Pertinent Information: 49 yo female adm to [facility] from behavioral health with abdominal pain - has h/o schizoaffective disorder.  PMH + for HTN, anxiety.  She had a low grade fever and was found to have  right middle and lower lobe pneumonia.  Swallow evaluation ordered.   Type of Study: Bedside swallow evaluation Diet Prior to this Study: Regular;Thin liquids Temperature Spikes Noted: Yes (low grade) Respiratory Status: Room air History of Recent Intubation: No Behavior/Cognition: Alert;Cooperative;Pleasant mood Oral Cavity - Dentition: Adequate natural dentition/normal for age Self-Feeding Abilities: Able to feed self Patient Positioning: Upright in bed Baseline Vocal Quality: Normal Volitional Cough: Weak Volitional Swallow: Able to elicit    Oral/Motor/Sensory Function Overall Oral Motor/Sensory Function: Impaired at baseline (limited lingual ROM, pt reports due 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)   [place] chips: Not tested   Thin Liquid Thin Liquid: Within functional limits Presentation: Cup    Nectar Thick Nectar Thick Liquid: Not tested   Honey Thick Honey Thick Liquid: Not tested   Puree Puree: Within functional limits Presentation: Self Fed   Solid   GO Functional Assessment Tool Used: clinical judgement Functional Limitations: Swallowing Swallow Current Status (W0981): At least 1 percent but less than 20 percent impaired, limited or restricted Swallow Goal Status [phone]): At least 1 percent but less than 20 percent impaired, limited or restricted Swallow Discharge Status [phone]): At least 1 percent but less than 20 percent impaired, limited or restricted  Solid: Within functional limits Presentation: Self Lisabeth Pick, MS [facility] SLP [phone]

## 2015-04-24 NOTE — Progress Notes (Signed)
Patient is unwilling to respond to questions tonight. Pt pushed tech away when attempting to get vital signs. Patient does not respond to RN request to swallow night time medications. At times she has her eyes closed, other times she looks forward, she does not follow movement around the room. Will continue to monitor.

## 2015-04-25 DIAGNOSIS — J189 Pneumonia, unspecified organism: Secondary | ICD-10-CM

## 2015-04-25 DIAGNOSIS — F331 Major depressive disorder, recurrent, moderate: Secondary | ICD-10-CM

## 2015-04-25 DIAGNOSIS — F061 Catatonic disorder due to known physiological condition: Secondary | ICD-10-CM | POA: Diagnosis not present

## 2015-04-25 DIAGNOSIS — I10 Essential (primary) hypertension: Secondary | ICD-10-CM

## 2015-04-25 DIAGNOSIS — F251 Schizoaffective disorder, depressive type: Secondary | ICD-10-CM

## 2015-04-25 MED ORDER — VENLAFAXINE HCL ER 75 MG PO CP24: 75.0000 mg | ORAL_CAPSULE | Freq: Every day | ORAL | Status: DC

## 2015-04-25 MED ORDER — LEVOFLOXACIN 750 MG PO TABS: 750.0000 mg | ORAL_TABLET | ORAL | Status: DC

## 2015-04-25 MED ORDER — RISPERIDONE 2 MG PO TBDP: 2.0000 mg | ORAL_TABLET | Freq: Two times a day (BID) | ORAL | Status: DC

## 2015-04-25 MED ORDER — FERROUS SULFATE 325 (65 FE) MG PO TABS: 325.0000 mg | ORAL_TABLET | Freq: Every day | ORAL | Status: DC

## 2015-04-25 MED ORDER — HYDROCHLOROTHIAZIDE 25 MG PO TABS: 12.5000 mg | ORAL_TABLET | Freq: Every day | ORAL | Status: DC

## 2015-04-25 MED ORDER — IBUPROFEN 800 MG PO TABS: 800.0000 mg | ORAL_TABLET | Freq: Three times a day (TID) | ORAL | Status: DC

## 2015-04-25 NOTE — Progress Notes (Signed)
Patient refuses care this morning. Resists when RN attempts to place O2 monitor on finger. Refuses to open her mouth or lift arm to temperature or blood pressure.

## 2015-04-25 NOTE — Progress Notes (Signed)
Pt is min verbally responsive when reviewing d/c packet.  Cousin Veronica in the room and transporting pt home.  Prescriptions given to pt. No further questions. VSS.

## 2015-04-25 NOTE — Discharge Summary (Signed)
Physician Discharge Summary Note  Patient:  Grace Mack is an 49 y.o., female  MRN:  [id number]  DOB:  12-23-1965  Patient phone:  [phone] (home)   Patient address:   [address] [place] [address],   Total Time spent with patient: Greater than 15 minutes  Date of Admission:  04-20-15 Date of Discharge: 04-23-15  Reason for Admission:  Worsening symptoms of Schizoaffective disorder  Principal Problem: Schizoaffective disorder [facility])  Discharge Diagnoses: Patient Active Problem List   Diagnosis Date Noted  . CAP (community acquired pneumonia) [J18.9] 04/24/2015  . 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 Schizoaffective disorder ([place]) [F25.9] 04/19/2015  . Major depressive disorder, recurrent episode ([place]) [F33.9] 04/19/2015  . Schizoaffective disorder, depressive type ([place]) [F25.1] 04/06/2015  . Uterus biforus [Q51.3] 01/12/2015  . Essential (primary) hypertension [I10] 12/24/2012  . Anemia, iron deficiency [D50.9] 06/23/2012  . Chronic hepatitis B with hepatic coma (HCC) [B18.1] 06/22/2012   Musculoskeletal: Strength & Muscle Tone: within normal limits Gait & Station: Unable to assess Patient leans: N/A  Psychiatric Specialty Exam: Physical Exam  Psychiatric: Her speech is normal and behavior is normal. Judgment and thought content normal. Her mood appears anxious. Her affect is not angry, not blunt, not labile and not inappropriate. Cognition and memory are impaired. She exhibits a depressed mood. She exhibits abnormal recent memory and abnormal remote memory.    Review of Systems  Unable to perform ROS   Blood pressure 134/85, pulse 95, temperature 97.4 F (36.3 C), temperature source Oral, resp. rate 20, last menstrual period 04/07/2015, SpO2 97 %.There is no weight on file to calculate BMI.  See Md's SRA   Has this patient used any form of tobacco in the last 30 days? (Cigarettes, Smokeless Tobacco, Cigars, and/or Pipes) No  Past Medical History:   Past Medical History  Diagnosis Date  . Hypertension   . Anxiety     Past Surgical History  Procedure Laterality Date  . Cervical biopsy     Family History:  Family History  Problem Relation Age of Onset  . CAD Father    Social History:  History  Alcohol Use No     History  Drug Use No    Social History   Social History  . Marital Status: Married    Spouse Name: N/A  . Number of Children: N/A  . Years of Education: N/A   Social History Main Topics  . Smoking status: Never Smoker   . Smokeless tobacco: Not on file  . Alcohol Use: No  . Drug Use: No  . Sexual Activity: No   Other Topics Concern  . Not on file   Social History Narrative   Risk to Self: Is patient at risk for suicide?: No Risk to Others: No Prior Inpatient Therapy: Yes Prior Outpatient Therapy: Yes  Level of Care: Inpatient  Hospital Course: Grace Mack is a 49 year old female with hx of Schizoaffective disorder. She is admitted to [facility] for mood stabilization treatment related to worsening symptoms of the Schizoaffective disorder. However, she was recently transferred to the ED for evaluation due to complaints of abdominal pain. While at the ED, she was diagnosed with pneumonia & started on antibiotics therapy. However, it appears today that her symptoms are worsening. This is evidenced by her refusal to take her medications, inability to make eye contact & presentation of mutism. She is currently being transferred back to the [facility] inpatient unit for further evaluation & treatments.  Consults:  psychiatry  Significant Diagnostic Studies:  labs: CBC with diff, CMP, UDS, toxicology tests, U/A, results reviewed, no changes  Discharge Vitals:   Blood pressure 134/85, pulse 95, temperature 97.4 F (36.3 C), temperature source Oral, resp. rate 20, last menstrual period 04/07/2015, SpO2 97 %. There is no weight on file to calculate BMI. Lab Results:   Results for orders placed or performed  during the hospital encounter of 04/23/15 (from the past 72 hour(s))  HIV antibody     Status: None   Collection Time: 04/23/15  6:40 PM  Result Value Ref Range   HIV Screen 4th Generation wRfx Non Reactive Non Reactive    Comment: (NOTE) Performed At: [facility] [place], [place] [id number] Lindon Romp MD [id number]   Culture, blood (routine x 2) Call MD if unable to obtain prior to antibiotics being given     Status: None (Preliminary result)   Collection Time: 04/23/15  6:40 PM  Result Value Ref Range   Specimen Description BLOOD RIGHT ARM    Special Requests BOTTLES DRAWN AEROBIC AND ANAEROBIC  5CC    Culture      NO GROWTH < 24 HOURS Performed at [facility]    Report Status PENDING   Culture, blood (routine x 2) Call MD if unable to obtain prior to antibiotics being given     Status: None (Preliminary result)   Collection Time: 04/23/15  7:00 PM  Result Value Ref Range   Specimen Description BLOOD RIGHT ARM    Special Requests BOTTLES DRAWN AEROBIC AND ANAEROBIC  10CC    Culture      NO GROWTH < 24 HOURS Performed at [facility]    Report Status PENDING   Basic metabolic panel     Status: None   Collection Time: 04/24/15  5:44 AM  Result Value Ref Range   Sodium 136 135 - 145 mmol/L   Potassium 3.8 3.5 - 5.1 mmol/L   Chloride 104 101 - 111 mmol/L   CO2 23 22 - 32 mmol/L   Glucose, Bld 78 65 - 99 mg/dL   BUN 11 6 - 20 mg/dL   Creatinine, Ser 0.71 0.44 - 1.00 mg/dL   Calcium 8.9 8.9 - 10.3 mg/dL   GFR calc non Af Amer >60 >60 mL/min   GFR calc Af Amer >60 >60 mL/min    Comment: (NOTE) The eGFR has been calculated using the CKD EPI equation. This calculation has not been validated in all clinical situations. eGFR's persistently <60 mL/min signify possible Chronic Kidney Disease.    Anion gap 9 5 - 15  CBC     Status: Abnormal   Collection Time: 04/24/15  5:44 AM  Result Value Ref Range   WBC 6.9 4.0 - 10.5 K/uL    RBC 4.23 3.87 - 5.11 MIL/uL   Hemoglobin 11.0 (L) 12.0 - 15.0 g/dL   HCT 33.2 (L) 36.0 - 46.0 %   MCV 78.5 78.0 - 100.0 fL   MCH 26.0 26.0 - 34.0 pg   MCHC 33.1 30.0 - 36.0 g/dL   RDW 17.3 (H) 11.5 - 15.5 %   Platelets 233 150 - 400 K/uL    Comment: REPEATED TO VERIFY  Lipase, blood     Status: None   Collection Time: 04/24/15  5:44 AM  Result Value Ref Range   Lipase 22 22 - 51 U/L  Strep pneumoniae urinary antigen     Status: None   Collection  Time: 04/24/15  9:33 AM  Result Value Ref Range   Strep Pneumo Urinary Antigen NEGATIVE NEGATIVE    Comment:        Infection due to S. pneumoniae cannot be absolutely ruled out since the antigen present may be below the detection limit of the test. Performed at [facility]     Physical Findings: AIMS:  , ,  ,  ,    CIWA:    COWS:     See Psychiatric Specialty Exam and Suicide Risk Assessment completed by Attending Physician prior to discharge.  Discharge destination:  Other:  [facility]  Is patient on multiple antipsychotic therapies at discharge:  No   Has Patient had three or more failed trials of antipsychotic monotherapy by history:  No  Recommended Plan for Multiple Antipsychotic Therapies: NA    Medication List    STOP taking these medications        azithromycin 250 MG tablet  Commonly known as:  ZITHROMAX Z-PAK      TAKE these medications      Indication   ferrous sulfate 325 (65 FE) MG tablet  Take 1 tablet (325 mg total) by mouth daily. For iron deficiency anemia   Indication:  Iron Deficiency     hydrochlorothiazide 25 MG tablet  Commonly known as:  HYDRODIURIL  Take 0.5 tablets (12.5 mg total) by mouth daily. For HTN   Indication:  High Blood Pressure     ibuprofen 800 MG tablet  Commonly known as:  ADVIL,MOTRIN  Take 1 tablet (800 mg total) by mouth 3 (three) times daily. For pain   Indication:  Pain     levofloxacin 750 MG tablet  Commonly known as:  LEVAQUIN  Take 1 tablet  (750 mg total) by mouth daily. For infection   Indication:  Infection     risperiDONE 2 MG disintegrating tablet  Commonly known as:  RISPERDAL M-TABS  Take 1 tablet (2 mg total) by mouth 2 (two) times daily. For mood control   Indication:  Mood control     risperiDONE 2 MG disintegrating tablet  Commonly known as:  RISPERDAL M-TABS  Take 1 tablet (2 mg total) by mouth 2 (two) times daily. For mood control   Indication:  Mood control     venlafaxine XR 75 MG 24 hr capsule  Commonly known as:  EFFEXOR-XR  Take 1 capsule (75 mg total) by mouth daily with breakfast. For depression   Indication:  Major Depressive Disorder       Follow-up Information    Follow up with Faye Ramsay, MD.   Specialty:  Internal Medicine   Contact information:   [address] [place] [address] [phone]     Follow-up recommendations: Transfer to the [facility] for evaluation related to medical issues.  Comments: Patient is being transferred to the [facility] for medical evaluation.  Total Discharge Time: Greater than 15 minutes  Signed: Encarnacion Slates, PMHNP, FNP-BC 04/25/2015, 10:15 AM

## 2015-04-25 NOTE — Discharge Instructions (Signed)

## 2015-04-25 NOTE — Discharge Summary (Signed)
Physician Discharge Summary  SHERRE WOOTON ZOX:[id number] DOB: March 13, 1966 DOA: 04/23/2015  PCP: No primary care provider on file.  Admit date: 04/23/2015 Discharge date: 04/25/2015  Recommendations for Outpatient Follow-up:  1. Pt will need to follow up with PCP in 2-3 weeks post discharge 2. Please obtain BMP to evaluate electrolytes and kidney function 3. Please also check CBC to evaluate Hg and Hct levels 4. Pt to continue taking Levaquin upon discharge to complete therapy for PNA 5. It was recommended that pt has follow up CXR in 3-4 weeks upon completion on ABX regimen to ensure resolution of PNA   Discharge Diagnoses:  Principal Problem:   Schizoaffective disorder (HCC) Active Problems:   Major depressive disorder, recurrent episode (HCC)   Essential (primary) hypertension   Catatonia (HCC)   CAP (community acquired pneumonia)    Discharge Condition: Stable  Diet recommendation: Heart healthy diet discussed in details     Brief Narrative:  49 y.o. female the PMH of schizoaffective disorder who was initially admitted to [facility] under IVC 04/20/15 with behavioral changes. She was sent to the ED on 04/22/15 for evaluation of abdominal pain, and was diagnosed with CAP ultimately discharged back to [facility] after being given an IM dose of Rocephin, with recommendations for oral antibiotics. 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  Assessment/Plan:   Principal Problem:  CAP (community acquired pneumonia) RML and RLL - Continue Levaquin upon discharge  - pt tolerating diet well, no aspiration reported - will need repeat CXR in 3-4 weeks to ensure resolution of PNA   Active Problems:  H/O HTN - continue HCTZ as per home medical regimen    Abdominal pain - Status post recent ultrasound which was unremarkable. LFTs WNL. Lipase WNL. - Given pain in RUQ, suspect this is referred pain from her right-sided  pneumonia.   Schizoaffective disorder (HCC)/Major depressive disorder, recurrent episode (HCC)/Catatonia [facility]) - Management per psychiatry. Risperdal and Effexor resumed. - pt does not meet inpatient psych criteria, d/c home with outpatient follow up   Family Communication: No family at the bedside. Disposition Plan: Home  Code Status: Full        Procedures/Studies: Dg Chest 2 View 04/22/2015    Right middle lobe and right lower lobe infiltrates, suspicious for pneumonia.   US Abdomen Complete 04/22/2015   Normal.     Consultations:  Psych  Antibiotics:  Continue with Levaquin upon discharge to complete therapy   Discharge Exam: Filed Vitals:   04/25/15 0830  BP: 134/85  Pulse: 95  Temp: 97.4 F (36.3 C)  Resp: 20   Filed Vitals:   04/23/15 2106 04/24/15 0511 04/24/15 1500 04/25/15 0830  BP: 129/73 117/63 107/84 134/85  Pulse: 96 89 108 95  Temp:  99.1 F (37.3 C) 98.6 F (37 C) 97.4 F (36.3 C)  TempSrc:  Oral Oral Oral  Resp: SpO2: 98% 98% 97% 97%    General: Pt is alert, not in acute distress Cardiovascular: Regular rate and rhythm, S1/S2 +, no murmurs, no rubs, no gallops Respiratory: Clear to auscultation bilaterally, no wheezing, no crackles, no rhonchi Abdominal: Soft, non tender, non distended, bowel sounds +, no guarding Extremities: no edema, no cyanosis, pulses palpable bilaterally DP and PT Neuro: Grossly nonfocal  Discharge Instructions  Discharge Instructions    Diet - low sodium heart healthy    Complete by:  As directed  Increase activity slowly    Complete by:  As directed             Medication List    STOP taking these medications        azithromycin 250 MG tablet  Commonly known as:  ZITHROMAX Z-PAK      TAKE these medications        ferrous sulfate 325 (65 FE) MG tablet  Take 325 mg by mouth daily.     hydrochlorothiazide 25 MG tablet  Commonly known as:  HYDRODIURIL  Take 12.5 mg by mouth daily.      ibuprofen 800 MG tablet  Commonly known as:  ADVIL,MOTRIN  Take 1 tablet (800 mg total) by mouth 3 (three) times daily.     levofloxacin 750 MG tablet  Commonly known as:  LEVAQUIN  Take 1 tablet (750 mg total) by mouth daily.     risperiDONE 2 MG disintegrating tablet  Commonly known as:  RISPERDAL M-TABS  Take 2 mg by mouth 2 (two) times daily.     venlafaxine XR 75 MG 24 hr capsule  Commonly known as:  EFFEXOR-XR  Take 1 capsule (75 mg total) by mouth daily with breakfast.           Follow-up Information    Follow up with Debbora Presto, MD.   Specialty:  Internal Medicine   Contact information:   [address] [place] [address] [phone]        The results of significant diagnostics from this hospitalization (including imaging, microbiology, ancillary and laboratory) are listed below for reference.     Microbiology: Recent Results (from the past 240 hour(s))  Culture, blood (routine x 2) Call MD if unable to obtain prior to antibiotics being given     Status: None (Preliminary result)   Collection Time: 04/23/15  6:40 PM  Result Value Ref Range Status   Specimen Description BLOOD RIGHT ARM  Final   Special Requests BOTTLES DRAWN AEROBIC AND ANAEROBIC  5CC  Final   Culture   Final    NO GROWTH < 24 HOURS Performed at [facility]    Report Status PENDING  Incomplete  Culture, blood (routine x 2) Call MD if unable to obtain prior to antibiotics being given     Status: None (Preliminary result)   Collection Time: 04/23/15  7:00 PM  Result Value Ref Range Status   Specimen Description BLOOD RIGHT ARM  Final   Special Requests BOTTLES DRAWN AEROBIC AND ANAEROBIC  10CC  Final   Culture   Final    NO GROWTH < 24 HOURS Performed at [facility]    Report Status PENDING  Incomplete     Labs: Basic Metabolic Panel:  Recent Labs Lab 04/19/15 0206 04/19/15 1423 04/22/15 2005 04/24/15 0544  NA 136 136 135 136   K 4.1 4.0 3.7 3.8  CL 103 102 102 104  CO2 GLUCOSE 91 90 90 78  BUN CREATININE 0.73 0.75 0.76 0.71  CALCIUM 9.0 9.1 9.3 8.9   Liver Function Tests:  Recent Labs Lab 04/19/15 1423 04/22/15 2005  AST 27 23  ALT 24 21  ALKPHOS 95 96  BILITOT 0.7 1.0  PROT 8.3* 8.2*  ALBUMIN 4.0 3.8    Recent Labs Lab 04/22/15 2005 04/24/15 0544  LIPASE 27 22   No results for input(s): AMMONIA in the last 168 hours. CBC:  Recent Labs  Lab 04/19/15 0206 04/19/15 1423 04/22/15 2005 04/24/15 0544  WBC 7.7 8.0 9.1 6.9  NEUTROABS  --  5.8 6.7  --   HGB 11.5* 12.4 11.7* 11.0*  HCT 34.1* 35.8* 34.1* 33.2*  MCV 78.4 76.7* 77.5* 78.5  PLT 164 164 224 233   Cardiac Enzymes:  Recent Labs Lab 04/19/15 1423 04/19/15 1615  TROPONINI 0.04* 0.04*   CBG:  Recent Labs Lab 04/21/15 0232  GLUCAP 101*     SIGNED: Time coordinating discharge: 30 minutes  MAGICK-Malichi Palardy, MD  [facility] 04/25/2015, 10:00 AM Pager [phone]  If 7PM-7AM, please contact night-coverage [email or web address] Password [email or web address]

## 2015-04-25 NOTE — BHH Suicide Risk Assessment (Signed)
[facility] Discharge Suicide Risk Assessment   Demographic Factors:  Divorced or widowed  Total Time spent with patient: 30 minutes  Musculoskeletal: Strength & Muscle Tone: spastic Gait & Station: unable to stand Patient leans: N/A  Psychiatric Specialty Exam: Physical Exam  Review of Systems  Unable to perform ROS: mental acuity    Blood pressure 129/81, pulse 84, temperature 98.9 F (37.2 C), temperature source Oral, resp. rate 18, height 5' 3.78" (1.62 m), weight 71.668 kg (158 lb), last menstrual period 04/07/2015, SpO2 99 %.Body mass index is 27.31 kg/(m^2).  General Appearance: Disheveled  Eye Contact::  None  Speech:  [id number]  Volume:  NA  Mood:  Unable to express  Affect:  Constricted  Thought Process:  Pt is mute  Orientation:  Other:  unable to assess  Thought Content:  unable to assess  Suicidal Thoughts:  did not express any - is mute  Homicidal Thoughts:  did not express any  Memory:  unable to assess  Judgement:  Poor  Insight:  Lacking  Psychomotor Activity:  Psychomotor Retardation  Concentration:  Poor  Recall:  Poor  Fund of Knowledge:Poor  Language: Poor  Akathisia:  No    AIMS (if indicated):     Assets:  Others:  access to health care  Sleep:  Number of Hours: 3.25  Cognition: Impaired,  Moderate  ADL's:  Impaired   Have you used any form of tobacco in the last 30 days? (Cigarettes, Smokeless Tobacco, Cigars, and/or Pipes): No  Has this patient used any form of tobacco in the last 30 days? (Cigarettes, Smokeless Tobacco, Cigars, and/or Pipes) unable to assess  Mental Status Per Nursing Assessment::   On Admission:     Current Mental Status by Physician: Pt appears catatonic , mute , seen in bed with no response , not taking food or drinks , refusing medications po,not reponding to verbal stimuli.  Loss Factors: Loss of significant relationship  Historical Factors: Impulsivity  Risk Reduction Factors:   access to health care  Continued  Clinical Symptoms:  Previous Psychiatric Diagnoses and Treatments  Cognitive Features That Contribute To Risk:  Closed-mindedness, Loss of executive function, Polarized thinking and Thought constriction (tunnel vision)    Suicide Risk:  Unable to assess, but likely moderate to high due to being divorced recently , recent diagnosis of mental illness, impulsivity.  Principal Problem: Schizoaffective disorder [facility]) Discharge Diagnoses:  Patient Active Problem List   Diagnosis Date Noted  . CAP (community acquired pneumonia) [J18.9] 04/24/2015  . 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 Schizoaffective disorder (HCC) [F25.9] 04/19/2015  . Major depressive disorder, recurrent episode (HCC) [F33.9] 04/19/2015  . Schizoaffective disorder, depressive type (HCC) [F25.1] 04/06/2015  . Uterus biforus [Q51.3] 01/12/2015  . Essential (primary) hypertension [I10] 12/24/2012  . Anemia, iron deficiency [D50.9] 06/23/2012  . Chronic hepatitis B with hepatic coma (HCC) [B18.1] 06/22/2012    Follow-up Information    Please follow up.   Why:  see a medical doctor in 2 days for recheck, have a 24 hour reevaluation if no better.      Plan Of Care/Follow-up recommendations:  Activity:  as recommended by medical service Diet:  see above Tests:  none Other:  none  Is patient on multiple antipsychotic therapies at discharge:  Yes,   Do you recommend tapering to monotherapy for antipsychotics?  Yes   Has Patient had three or more failed trials of antipsychotic monotherapy by history:  No  Recommended Plan for Multiple Antipsychotic Therapies: Taper to  monotherapy as described:  as per patients response.    Joanann Mies MD 04/23/2015, 2:40 PM

## 2015-04-26 ENCOUNTER — Encounter (HOSPITAL_COMMUNITY): Payer: Self-pay | Admitting: Emergency Medicine

## 2015-04-26 ENCOUNTER — Emergency Department (HOSPITAL_COMMUNITY): Payer: BC Managed Care – PPO

## 2015-04-26 ENCOUNTER — Emergency Department (HOSPITAL_COMMUNITY)
Admission: EM | Admit: 2015-04-26 | Discharge: 2015-04-27 | Disposition: A | Discharge status: Other Acute Inpatient Hospital | Payer: BC Managed Care – PPO | Attending: Emergency Medicine | Admitting: Emergency Medicine

## 2015-04-26 DIAGNOSIS — J690 Pneumonitis due to inhalation of food and vomit: Secondary | ICD-10-CM | POA: Insufficient documentation

## 2015-04-26 DIAGNOSIS — F419 Anxiety disorder, unspecified: Secondary | ICD-10-CM | POA: Diagnosis not present

## 2015-04-26 DIAGNOSIS — F259 Schizoaffective disorder, unspecified: Secondary | ICD-10-CM | POA: Diagnosis present

## 2015-04-26 DIAGNOSIS — F251 Schizoaffective disorder, depressive type: Secondary | ICD-10-CM | POA: Diagnosis not present

## 2015-04-26 DIAGNOSIS — Z046 Encounter for general psychiatric examination, requested by authority: Secondary | ICD-10-CM | POA: Diagnosis present

## 2015-04-26 DIAGNOSIS — I10 Essential (primary) hypertension: Secondary | ICD-10-CM | POA: Diagnosis not present

## 2015-04-26 LAB — COMPREHENSIVE METABOLIC PANEL
ALBUMIN: 3.5 g/dL (ref 3.5–5.0)
ALT: 23 U/L (ref 14–54)
ANION GAP: 10 (ref 5–15)
AST: 35 U/L (ref 15–41)
Alkaline Phosphatase: 92 U/L (ref 38–126)
BUN: 12 mg/dL (ref 6–20)
CHLORIDE: 103 mmol/L (ref 101–111)
CO2: 21 mmol/L — ABNORMAL LOW (ref 22–32)
Calcium: 9.1 mg/dL (ref 8.9–10.3)
Creatinine, Ser: 0.78 mg/dL (ref 0.44–1.00)
GFR calc Af Amer: >60 mL/min (ref >60)
GFR calc non Af Amer: >60 mL/min (ref >60)
GLUCOSE: 91 mg/dL (ref 65–99)
POTASSIUM: 3.6 mmol/L (ref 3.5–5.1)
SODIUM: 134 mmol/L — AB (ref 135–145)
Total Bilirubin: 0.8 mg/dL (ref 0.3–1.2)
Total Protein: 7.9 g/dL (ref 6.5–8.1)

## 2015-04-26 LAB — RAPID URINE DRUG SCREEN, HOSP PERFORMED
AMPHETAMINES: NOT DETECTED
BARBITURATES: NOT DETECTED
BENZODIAZEPINES: NOT DETECTED
Cocaine: NOT DETECTED
Opiates: NOT DETECTED
Tetrahydrocannabinol: NOT DETECTED

## 2015-04-26 LAB — CBC
HEMATOCRIT: 35 % — AB (ref 36.0–46.0)
Hemoglobin: 12 g/dL (ref 12.0–15.0)
MCH: 26.7 pg (ref 26.0–34.0)
MCHC: 34.3 g/dL (ref 30.0–36.0)
MCV: 78 fL (ref 78.0–100.0)
PLATELETS: 264 10*3/uL (ref 150–400)
RBC: 4.49 MIL/uL (ref 3.87–5.11)
RDW: 16.6 % — AB (ref 11.5–15.5)
WBC: 7.1 10*3/uL (ref 4.0–10.5)

## 2015-04-26 LAB — LEGIONELLA PNEUMOPHILA SEROGP 1 UR AG: L. PNEUMOPHILA SEROGP 1 UR AG: NEGATIVE

## 2015-04-26 LAB — ETHANOL: Alcohol, Ethyl (B): <5 mg/dL (ref <5)

## 2015-04-26 LAB — SALICYLATE LEVEL: Salicylate Lvl: <4 mg/dL (ref 2.8–30.0)

## 2015-04-26 LAB — ACETAMINOPHEN LEVEL

## 2015-04-26 MED ORDER — IOHEXOL 300 MG/ML  SOLN
100.0000 mL | Freq: Once | INTRAMUSCULAR | Status: AC | PRN
  Administered 2015-04-26: 80 mL via INTRAVENOUS

## 2015-04-26 MED ORDER — LEVOFLOXACIN 750 MG PO TABS
750.0000 mg | ORAL_TABLET | Freq: Once | ORAL | Status: AC
  Administered 2015-04-27: 750 mg via ORAL
  Filled 2015-04-26: qty 1

## 2015-04-26 NOTE — ED Notes (Addendum)
Pt brought to ED by [employer] IVC'd. Per IVC papers pt has a hx of schizophrenia. Pt has been refusing to take medications. Pt was sent to [facility], but came down with PNA which she was hospitalized for. Pt was discharged after hospital stay. Petitioner thinks pt was supposed to go back to [facility] after hospital discharge. Pt has been refusing to leave cousin's home, driving with her eyes closed, leaving home dressed inappropriately and mishandling her finances.

## 2015-04-26 NOTE — ED Notes (Addendum)
Pt has not been violent with [employer]. Pt denies SI/HI and substance abuse. Pt reports she has also not filled prescriptions after hospitalization.

## 2015-04-26 NOTE — ED Notes (Addendum)
Pt was brought back from the main ER. She appears very blunted and depressed. Pt stated she came into the ER a few days ago for pneumonia and chest pain. She has not been able to get her antiboitic because she could not afford it.  She was given a dinner tray and presently is sitting in the cardiac chair eating. Pt is pleasant and cooperative. Report given to the oncoming shift. Phoned Dahlia Client the NP to make her aware the pt needs to be medically cleared. Pt stated she has not been feeling good.NP made aware. Pt is very pleasant and cooperative. She stated she can not afford any of her medications.

## 2015-04-26 NOTE — Progress Notes (Signed)
CSW unable to assess patient before discharge.   Olga Coaster, LCSW  Clinical Social Work  [employer] [phone]

## 2015-04-26 NOTE — ED Notes (Signed)
Pts Mother is wanting to know if pt is discharged or sent to  [facility] [phone]

## 2015-04-26 NOTE — ED Notes (Signed)
Pt alert x3 no c/o pain, sitting up in chair watching T.V. Flat effect, PA at bedside will continue to monitor. Estill Dooms, RN 7:48 PM 04/26/2015

## 2015-04-27 ENCOUNTER — Inpatient Hospital Stay (HOSPITAL_COMMUNITY)
Admission: AD | Admit: 2015-04-27 | Discharge: 2015-05-07 | DRG: 885 | Disposition: A | Discharge status: Home / Self Care | Payer: BC Managed Care – PPO | Source: Intra-hospital | Attending: Psychiatry | Admitting: Psychiatry

## 2015-04-27 ENCOUNTER — Encounter (HOSPITAL_COMMUNITY): Payer: Self-pay

## 2015-04-27 DIAGNOSIS — F333 Major depressive disorder, recurrent, severe with psychotic symptoms: Secondary | ICD-10-CM | POA: Diagnosis present

## 2015-04-27 DIAGNOSIS — E221 Hyperprolactinemia: Secondary | ICD-10-CM | POA: Clinically undetermined

## 2015-04-27 DIAGNOSIS — I10 Essential (primary) hypertension: Secondary | ICD-10-CM | POA: Diagnosis present

## 2015-04-27 DIAGNOSIS — G4700 Insomnia, unspecified: Secondary | ICD-10-CM | POA: Diagnosis present

## 2015-04-27 DIAGNOSIS — F203 Undifferentiated schizophrenia: Secondary | ICD-10-CM | POA: Diagnosis present

## 2015-04-27 DIAGNOSIS — B181 Chronic viral hepatitis B without delta-agent: Secondary | ICD-10-CM | POA: Diagnosis present

## 2015-04-27 DIAGNOSIS — J189 Pneumonia, unspecified organism: Secondary | ICD-10-CM | POA: Diagnosis present

## 2015-04-27 DIAGNOSIS — Z8249 Family history of ischemic heart disease and other diseases of the circulatory system: Secondary | ICD-10-CM

## 2015-04-27 DIAGNOSIS — F251 Schizoaffective disorder, depressive type: Secondary | ICD-10-CM | POA: Diagnosis not present

## 2015-04-27 LAB — URINALYSIS, ROUTINE W REFLEX MICROSCOPIC
Bilirubin Urine: NEGATIVE
Glucose, UA: NEGATIVE mg/dL
Ketones, ur: NEGATIVE mg/dL
NITRITE: NEGATIVE
PH: 6 (ref 5.0–8.0)
Protein, ur: NEGATIVE mg/dL
SPECIFIC GRAVITY, URINE: 1.01 (ref 1.005–1.030)
UROBILINOGEN UA: 1 mg/dL (ref 0.0–1.0)

## 2015-04-27 LAB — URINE MICROSCOPIC-ADD ON

## 2015-04-27 MED ORDER — ENSURE ENLIVE PO LIQD
237.0000 mL | Freq: Three times a day (TID) | ORAL | Status: DC
  Administered 2015-04-28 – 2015-04-30 (×3): 237 mL via ORAL

## 2015-04-27 MED ORDER — LEVOFLOXACIN 750 MG PO TABS
750.0000 mg | ORAL_TABLET | Freq: Every day | ORAL | Status: DC
  Filled 2015-04-27: qty 1

## 2015-04-27 MED ORDER — ONDANSETRON HCL 4 MG PO TABS: 4.0000 mg | ORAL_TABLET | Freq: Three times a day (TID) | ORAL | Status: DC | PRN

## 2015-04-27 MED ORDER — IBUPROFEN 200 MG PO TABS: 600.0000 mg | ORAL_TABLET | Freq: Three times a day (TID) | ORAL | Status: DC | PRN

## 2015-04-27 MED ORDER — NICOTINE 21 MG/24HR TD PT24: 21.0000 mg | MEDICATED_PATCH | Freq: Every day | TRANSDERMAL | Status: DC

## 2015-04-27 MED ORDER — LEVOFLOXACIN 750 MG PO TABS
750.0000 mg | ORAL_TABLET | Freq: Every day | ORAL | Status: AC
  Administered 2015-04-28 – 2015-05-06 (×9): 750 mg via ORAL
  Filled 2015-04-27 (×9): qty 1

## 2015-04-27 MED ORDER — VENLAFAXINE HCL ER 75 MG PO CP24
75.0000 mg | ORAL_CAPSULE | Freq: Every day | ORAL | Status: DC
  Administered 2015-04-27: 75 mg via ORAL
  Filled 2015-04-27 (×2): qty 1

## 2015-04-27 MED ORDER — HYDROCHLOROTHIAZIDE 12.5 MG PO CAPS
12.5000 mg | ORAL_CAPSULE | Freq: Every day | ORAL | Status: DC
  Administered 2015-04-28 – 2015-05-07 (×10): 12.5 mg via ORAL
  Filled 2015-04-27 (×12): qty 1

## 2015-04-27 MED ORDER — LEVOFLOXACIN 750 MG PO TABS
750.0000 mg | ORAL_TABLET | Freq: Every day | ORAL | Status: DC
  Administered 2015-04-27: 750 mg via ORAL
  Filled 2015-04-27 (×2): qty 1

## 2015-04-27 MED ORDER — MAGNESIUM HYDROXIDE 400 MG/5ML PO SUSP: 30.0000 mL | Freq: Every day | ORAL | Status: DC | PRN

## 2015-04-27 MED ORDER — VENLAFAXINE HCL ER 75 MG PO CP24
75.0000 mg | ORAL_CAPSULE | Freq: Every day | ORAL | Status: DC
  Administered 2015-04-28 – 2015-05-01 (×4): 75 mg via ORAL
  Filled 2015-04-27 (×5): qty 1

## 2015-04-27 MED ORDER — RISPERIDONE 2 MG PO TBDP
2.0000 mg | ORAL_TABLET | Freq: Two times a day (BID) | ORAL | Status: DC
  Administered 2015-04-27: 2 mg via ORAL
  Filled 2015-04-27 (×4): qty 1

## 2015-04-27 MED ORDER — ACETAMINOPHEN 325 MG PO TABS: 650.0000 mg | ORAL_TABLET | Freq: Four times a day (QID) | ORAL | Status: DC | PRN

## 2015-04-27 MED ORDER — LEVOFLOXACIN 750 MG PO TABS
750.0000 mg | ORAL_TABLET | Freq: Every day | ORAL | Status: DC
Start: 2015-04-27 — End: 2015-04-27

## 2015-04-27 MED ORDER — ALUM & MAG HYDROXIDE-SIMETH 200-200-20 MG/5ML PO SUSP: 30.0000 mL | ORAL | Status: DC | PRN

## 2015-04-27 MED ORDER — HYDROCHLOROTHIAZIDE 12.5 MG PO CAPS
12.5000 mg | ORAL_CAPSULE | Freq: Every day | ORAL | Status: DC
  Administered 2015-04-27: 12.5 mg via ORAL
  Filled 2015-04-27: qty 1

## 2015-04-27 MED ORDER — RISPERIDONE 2 MG PO TBDP: 2.0000 mg | ORAL_TABLET | Freq: Two times a day (BID) | ORAL | Status: DC

## 2015-04-27 MED ORDER — ZOLPIDEM TARTRATE 5 MG PO TABS: 5.0000 mg | ORAL_TABLET | Freq: Every evening | ORAL | Status: DC | PRN

## 2015-04-27 MED ORDER — RISPERIDONE 2 MG PO TBDP
2.0000 mg | ORAL_TABLET | Freq: Two times a day (BID) | ORAL | Status: DC
  Filled 2015-04-27: qty 2
  Filled 2015-04-27 (×8): qty 1

## 2015-04-27 MED ORDER — LORAZEPAM 1 MG PO TABS: 1.0000 mg | ORAL_TABLET | Freq: Three times a day (TID) | ORAL | Status: DC | PRN

## 2015-04-27 NOTE — ED Notes (Signed)
Pt. Noted sleeping in room. No complaints or concerns voiced. No distress or abnormal behavior noted. Will continue to monitor with security cameras. Q 15 minute rounds continue. 

## 2015-04-27 NOTE — ED Notes (Signed)
Up to the bathroom, [employer] here to transport

## 2015-04-27 NOTE — Progress Notes (Signed)
12:09pm. Dr. Shela Commons upholds pt's IVC. CSW faxed to magistrate and filed paperwork. CSW also faxed first exam over to [facility] so that patient cant xfer over.  [facility] Clinical Social Worker [facility] phone: [phone]

## 2015-04-27 NOTE — ED Notes (Signed)
Pt up to th desk requesting to talk to the MD and to see the IVC papers.  Copy provided will relay request to Dr Shela Commons

## 2015-04-27 NOTE — ED Notes (Signed)
OK to transport -[employer] contacted for transport

## 2015-04-27 NOTE — Progress Notes (Signed)
Did not attend group 

## 2015-04-27 NOTE — BHH Counselor (Signed)
Disposition: Per Hulan Fess, NP, Pt meets inpt criteria for stabilization. Per Binnie Rail, AC, Pt is accepted to [facility] 506-1 under the care of Dr Elna Breslow. Pt can come after breakfast.

## 2015-04-27 NOTE — Progress Notes (Signed)
Writer entered her room and observed her lying on the bench in her room with the lights on. Writer asked if she was ok and she replied yes. Writer encouraged her to lie on her bed which maybe more comfortable and she reported that she was comfortable. Patient is isolative to her room and refused her ensure. She denies si/hi/a/v hallucinations. Patient safety maintained with 15 min checks.
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
## 2015-04-27 NOTE — ED Notes (Addendum)
Pt ambulatory w/o difficulty to [facility] w/ [employer], belongings sent with pt.

## 2015-04-27 NOTE — ED Notes (Signed)
Up on the phone 

## 2015-04-27 NOTE — ED Notes (Signed)
Saline lock Dcd with cath intact.

## 2015-04-27 NOTE — Progress Notes (Signed)
Patient is a 49 year old African-American female with history of Schizophrenia who is admitted to [address] hall.  Patient presents with depressed mood and affect .  Patient also presents with thought blocking. Refused to participate during assessment.  States "she is forced against her will."  Refused to sign consents for treatment.  MD Eappen made aware.  Patient appearance is disheveled and with body odor.  Patient refused to respond to most of the assessment questions.  Patient only response is "I don't want to talk about it."  Patient oriented to unit, staff and room.  Routine safety checks initiated.

## 2015-04-27 NOTE — ED Notes (Signed)
Pt. To SAPPU from ED ambulatory without difficulty, to room 37 . Report from [facility]. Pt. Is alert, warm and dry in no distress. Pt. Denies SI, HI, and AVH. Pt. Calm and cooperative. Pt. Made aware of security cameras and Q15 minute rounds. Pt. Encouraged to let Nursing staff know of any concerns or needs.

## 2015-04-27 NOTE — ED Notes (Signed)
Pt up to the desk anking about when she will be transfered

## 2015-04-27 NOTE — ED Provider Notes (Signed)
CSN: [id number]     Arrival date & time 04/26/15  1726 History   First MD Initiated Contact with Patient 04/26/15 1837     Chief Complaint  Patient presents with  . IVC      (Consider location/radiation/quality/duration/timing/severity/associated sxs/prior Treatment) The history is provided by the patient, medical records and the police (IVC paperwork). No language interpreter was used.     LARYSA PALL is a 49 y.o. female  with a hx of schizoaffective disorder depressed type, anxiety, hypertension, who presents to the ED under IVC due to family concern about her behavior.  Patient reports the police came to get her today to bring her back to the hospital for her pneumonia. Patient reports she has not taken any of her medications due to financial strain including no antibiotics. She denies fever, chills, headache, neck pain, chest pain, shortness of breath, abdominal pain, nausea, vomiting, diarrhea, weakness, dizziness, syncope.    IVC paperwork is filled out by patient's first cousin and states that the patient has not been taking medications for her condition. He reports the patient was admitted to behavioral health several weeks. She was transferred to [facility] with pneumonia and 2 days later she was discharged but was "mistakenly sent home instead of being sent back to behavioral health."   IVC. Lower states that concerning behaviors include driving with her eyes closed, leaving home in her pajamas without shoes and was handling her finances. It states she appears to be a danger to herself and possibly others. She denies homicidal or suicidal ideations. She denies auditory or visual hallucinations.     Past Medical History  Diagnosis Date  . Hypertension   . Anxiety    Past Surgical History  Procedure Laterality Date  . Cervical biopsy     Family History  Problem Relation Age of Onset  . CAD Father    Social History  Substance Use Topics  . Smoking status: Never  Smoker   . Smokeless tobacco: None  . Alcohol Use: No   OB History    No data available     Review of Systems  Constitutional: Negative for fever, diaphoresis, appetite change, fatigue and unexpected weight change.  HENT: Negative for mouth sores.   Eyes: Negative for visual disturbance.  Respiratory: Positive for cough. Negative for chest tightness, shortness of breath and wheezing.   Cardiovascular: Negative for chest pain.  Gastrointestinal: Negative for nausea, vomiting, abdominal pain, diarrhea and constipation.  Endocrine: Negative for polydipsia, polyphagia and polyuria.  Genitourinary: Negative for dysuria, urgency, frequency and hematuria.  Musculoskeletal: Negative for back pain and neck stiffness.  Skin: Negative for rash.  Allergic/Immunologic: Negative for immunocompromised state.  Neurological: Negative for syncope, light-headedness and headaches.  Hematological: Does not bruise/bleed easily.  Psychiatric/Behavioral: Positive for behavioral problems. Negative for sleep disturbance.      Allergies  Review of patient's allergies indicates no known allergies.  Home Medications   Prior to Admission medications   Medication Sig Start Date End Date Taking? Authorizing Provider  ibuprofen (ADVIL,MOTRIN) 800 MG tablet Take 1 tablet (800 mg total) by mouth 3 (three) times daily. For pain 04/25/15  Yes Sanjuana Kava, NP  ferrous sulfate 325 (65 FE) MG tablet Take 1 tablet (325 mg total) by mouth daily. For iron deficiency anemia Patient not taking: Reported on 04/26/2015 04/25/15   Sanjuana Kava, NP  hydrochlorothiazide (HYDRODIURIL) 25 MG tablet Take 0.5 tablets (12.5 mg total) by mouth daily. For HTN Patient not  taking: Reported on 04/26/2015 04/25/15   Sanjuana Kava, NP  levofloxacin (LEVAQUIN) 750 MG tablet Take 1 tablet (750 mg total) by mouth daily. For infection Patient not taking: Reported on 04/26/2015 04/25/15   Sanjuana Kava, NP  risperiDONE (RISPERDAL M-TABS) 2 MG  disintegrating tablet Take 1 tablet (2 mg total) by mouth 2 (two) times daily. For mood control Patient not taking: Reported on 04/26/2015 04/25/15 05/25/15  Sanjuana Kava, NP  risperiDONE (RISPERDAL M-TABS) 2 MG disintegrating tablet Take 1 tablet (2 mg total) by mouth 2 (two) times daily. For mood control Patient not taking: Reported on 04/26/2015 04/25/15   Sanjuana Kava, NP  venlafaxine XR (EFFEXOR-XR) 75 MG 24 hr capsule Take 1 capsule (75 mg total) by mouth daily with breakfast. For depression Patient not taking: Reported on 04/26/2015 04/25/15   Sanjuana Kava, NP   BP 127/75 mmHg  Pulse 89  Temp(Src) 98.1 F (36.7 C) (Oral)  Resp 18  SpO2 99%  LMP 04/07/2015 (Approximate) Physical Exam  Constitutional: She appears well-developed and well-nourished. No distress.  Awake, alert, nontoxic appearance  HENT:  Head: Normocephalic and atraumatic.  Mouth/Throat: Oropharynx is clear and moist. No oropharyngeal exudate.  Eyes: Conjunctivae are normal. No scleral icterus.  Neck: Normal range of motion. Neck supple.  Cardiovascular: Normal rate, regular rhythm, normal heart sounds and intact distal pulses.   No murmur heard. Pulmonary/Chest: Effort normal. No respiratory distress. She has decreased breath sounds in the right middle field and the right lower field. She has no wheezes. She has no rhonchi. She has no rales.  Equal chest expansion Decreased breath sounds in the right lower lobe  Abdominal: Soft. Bowel sounds are normal. She exhibits no mass. There is no tenderness. There is no rebound and no guarding.  Musculoskeletal: Normal range of motion. She exhibits no edema.  Neurological: She is alert.  Speech is clear and goal oriented Moves extremities without ataxia  Skin: Skin is warm and dry. She is not diaphoretic.  Psychiatric: Her affect is blunt. Her speech is delayed. She is slowed and withdrawn. She is inattentive.  Nursing note and vitals reviewed.   ED Course  Procedures  (including critical care time) Labs Review Labs Reviewed  COMPREHENSIVE METABOLIC PANEL - Abnormal; Notable for the following:    Sodium 134 (*)    CO2 21 (*)    All other components within normal limits  ACETAMINOPHEN LEVEL - Abnormal; Notable for the following:    Acetaminophen (Tylenol), Serum <10 (*)    All other components within normal limits  CBC - Abnormal; Notable for the following:    HCT 35.0 (*)    RDW 16.6 (*)    All other components within normal limits  ETHANOL  SALICYLATE LEVEL  URINE RAPID DRUG SCREEN, HOSP PERFORMED  URINALYSIS, ROUTINE W REFLEX MICROSCOPIC (NOT AT [facility])    Imaging Review Dg Chest 2 View  04/26/2015   CLINICAL DATA:  Cough and history of pneumonia.  EXAM: CHEST  2 VIEW  COMPARISON:  04/22/2015  FINDINGS: Cardiomediastinal silhouette is normal. Mediastinal contours appear intact.  There is an airspace opacity overlying the right lower lobe. There is a right pleural effusion. There is no evidence of pneumothorax.  Osseous structures are without acute abnormality. Soft tissues are grossly normal.  IMPRESSION: Round opacity overlying the right lower lobe, with associated right pleural effusion. This may represent an airspace consolidation with right pleural effusion, or loculated right pleural effusion.   Electronically Signed  By: Ted Mcalpine M.D.   On: 04/26/2015 21:05   Ct Chest W Contrast  04/26/2015   CLINICAL DATA:  Worsened cough. Recently diagnosed pneumonia. Noncompliant with medications.  EXAM: CT CHEST WITH CONTRAST  TECHNIQUE: Multidetector CT imaging of the chest was performed during intravenous contrast administration.  CONTRAST:  80mL OMNIPAQUE IOHEXOL 300 MG/ML  SOLN  COMPARISON:  04/26/2015 radiographs  FINDINGS: There is confluent airspace opacity in the lateral periphery of the right lower lobe and right middle lobe with air bronchograms. This is consistent with the clinically described history of pneumonia. The left lung is clear  except for minimal patchy opacity in the lateral costophrenic angle, axial image 41 series 7. Central airways are patent. There is a trace right pleural effusion. No pathologic adenopathy. No significant abnormality in the upper abdomen.  IMPRESSION: Consolidated right lateral base infiltrate consistent with pneumonia. Trace right pleural effusion.   Electronically Signed   By: Ellery Plunk M.D.   On: 04/26/2015 23:22   I have personally reviewed and evaluated these images and lab results as part of my medical decision-making.   EKG Interpretation None      MDM   Final diagnoses:  Schizoaffective disorder, depressive type (HCC)  Essential (primary) hypertension  Aspiration pneumonia of right lower lobe, unspecified aspiration pneumonia type (HCC)   Seniah T Huxtable presents with IVC. Patient recently diagnosed and treated with pneumonia but did not continue outpatient antibiotics. No fever or signs of sepsis today.  Patient with tachycardia at triage however on my exam no tachycardia was noted. Labs are reassuring.    Chest x-ray with round opacity overlying the right lobe.  No evidence of abscess but concern for possible loculated right pleural effusion. Will obtain CT scan.  12:05 AM Pt is medically cleared.  CT scan shows consolidated right lateral base infiltrate without evidence of abscess. She will be started on levaquin.  She remained afebrile and normotensive.  She continues to deny suicidal or homicidal ideations.  She will need TTS consult to determine the validity of her IVC.  She has Levaquin scheduled daily 10 days.   Home medications ordered.    BP 127/75 mmHg  Pulse 89  Temp(Src) 98.1 F (36.7 C) (Oral)  Resp 18  SpO2 99%  LMP 04/07/2015 (Approximate)   Dahlia Client Hiro Vipond, PA-C 04/27/15 1610  Alvira Monday, MD 04/30/15 1733

## 2015-04-27 NOTE — ED Notes (Signed)
Patient noted pacing the hall.

## 2015-04-27 NOTE — ED Notes (Signed)
Pt transferred to SAPU, report given, no c/o pain will continue to monitor.

## 2015-04-27 NOTE — ED Notes (Signed)
Dr J into see 

## 2015-04-27 NOTE — ED Notes (Signed)
D-  Patient has been in room complaining of pain due to the bed being hard.  Patient is fixated on having her bed changed or being moved to another room due to her bed being hard and causing her leg pain.  Patient stated "I need to get out of here.  I have to leave this bed is making my legs and my back hurt."  Patient continued to complain of pain when in the bed but stated when she was out of the bed her pain was 0.  Patient was noted to be anxious and irritable.  A-  Patient was offered pain medication, but refused.  Offered patient scheduled medications and refused her Risperdal stating "I am not taking the Risperdal because I am taking the Effexor.    R- Patient refused pain medications, patient refused Risperdal.  Patient denies SI/HI AH/VH.  Patient has had no periods of behavioral dyscontrol.

## 2015-04-27 NOTE — Tx Team (Signed)
Initial Interdisciplinary Treatment Plan   PATIENT STRESSORS: Financial difficulties Health problems Medication change or noncompliance   PATIENT STRENGTHS: Supportive family/friends   PROBLEM LIST: Problem List/Patient Goals Date to be addressed Date deferred Reason deferred Estimated date of resolution  Depression 04/27/2015           Apathy 04/27/2015           Self-neglect 04/27/2015                              DISCHARGE CRITERIA:  Adequate post-discharge living arrangements Safe-care adequate arrangements made Verbal commitment to aftercare and medication compliance  PRELIMINARY DISCHARGE PLAN: Placement in alternative living arrangements  PATIENT/FAMIILY INVOLVEMENT: This treatment plan has been presented to and reviewed with the patient, Grace Mack, and/or family member.  The patient and family have been given the opportunity to ask questions and make suggestions.  Mickie Bail 04/27/2015, 3:20 PM

## 2015-04-28 ENCOUNTER — Encounter (HOSPITAL_COMMUNITY): Payer: Self-pay | Admitting: Psychiatry

## 2015-04-28 DIAGNOSIS — J189 Pneumonia, unspecified organism: Secondary | ICD-10-CM | POA: Diagnosis present

## 2015-04-28 DIAGNOSIS — F333 Major depressive disorder, recurrent, severe with psychotic symptoms: Secondary | ICD-10-CM | POA: Diagnosis present

## 2015-04-28 LAB — CULTURE, BLOOD (ROUTINE X 2)
CULTURE: NO GROWTH
Culture: NO GROWTH

## 2015-04-28 LAB — URINALYSIS W MICROSCOPIC (NOT AT ARMC)
Bilirubin Urine: NEGATIVE
GLUCOSE, UA: NEGATIVE mg/dL
Ketones, ur: NEGATIVE mg/dL
Nitrite: NEGATIVE
PH: 6.5 (ref 5.0–8.0)
Protein, ur: NEGATIVE mg/dL
Specific Gravity, Urine: 1.011 (ref 1.005–1.030)
UROBILINOGEN UA: 1 mg/dL (ref 0.0–1.0)

## 2015-04-28 MED ORDER — DOCUSATE SODIUM 100 MG PO CAPS
100.0000 mg | ORAL_CAPSULE | Freq: Two times a day (BID) | ORAL | Status: DC
  Administered 2015-04-28 – 2015-05-07 (×17): 100 mg via ORAL
  Filled 2015-04-28 (×24): qty 1

## 2015-04-28 NOTE — Progress Notes (Signed)
Patient ID: Grace Mack, female   DOB: April 28, 1966, 49 y.o.   MRN: [id number]  DAR: Pt. Denies SI/HI and A/V Hallucinations. She reports sleep last night was good, appetite is fair, energy level is normal, and concentration is good. She rates her depression 0/10, hopelessness 0/10, and anxiety 8/10. Patient does not report any pain or discomfort at this time. Support and encouragement provided to the patient. Patient took her medication as prescribed except Risperdal. Writer encouraged however patient refused. Patient initially refused vitals this morning per night shift staff however patient was willing later. Patient also ate a small amount of her breakfast 25% and is seen in the milieu and making phone calls. Patient does appear preoccupied and does continue to have thought blocking She is encouraged to come to writer with questions or concerns. Patient denies any concerns at this time. Q15 minute checks are maintained for safety.

## 2015-04-28 NOTE — [facility] Suicide Risk Assessment (Signed)
[facility] Admission Suicide Risk Assessment   Nursing information obtained from:  Patient Demographic factors:  Unemployed Current Mental Status:  NA Loss Factors:   (patient refuse to answer.) Historical Factors:   (patient refuse to answer.) Risk Reduction Factors:   (Unable to assess.) Total Time spent with patient: 30 minutes Principal Problem: MDD (major depressive disorder), recurrent, severe, with psychosis (HCC) Diagnosis:   Patient Active Problem List   Diagnosis Date Noted  . CAP (community acquired pneumonia) [J18.9] 04/28/2015  . MDD (major depressive disorder), recurrent, severe, with psychosis (HCC) [F33.3] 04/28/2015  . Uterus biforus [Q51.3] 01/12/2015  . Essential (primary) hypertension [I10] 12/24/2012  . Anemia, iron deficiency [D50.9] 06/23/2012  . Chronic hepatitis B with hepatic coma (HCC) [B18.1] 06/22/2012     Continued Clinical Symptoms:  Alcohol Use Disorder Identification Test Final Score (AUDIT): 0 The "Alcohol Use Disorders Identification Test", Guidelines for Use in Primary Care, Second Edition.  World Science writer [employer]). Score between 0-7:  no or low risk or alcohol related problems. Score between 8-15:  moderate risk of alcohol related problems. Score between 16-19:  high risk of alcohol related problems. Score 20 or above:  warrants further diagnostic evaluation for alcohol dependence and treatment.   CLINICAL FACTORS:   Depression:   Anhedonia Severe Previous Psychiatric Diagnoses and Treatments   Musculoskeletal: Strength & Muscle Tone: within normal limits Gait & Station: normal Patient leans: N/A  Psychiatric Specialty Exam: Physical Exam  Review of Systems  Constitutional: Positive for malaise/fatigue.  Psychiatric/Behavioral: Positive for depression.  All other systems reviewed and are negative.   Blood pressure 123/75, pulse 100, temperature 98 F (36.7 C), temperature source Oral, resp. rate 18, height  (1.626 m),  weight 69.854 kg (154 lb), last menstrual period 04/07/2015, SpO2 100 %.Body mass index is 26.42 kg/(m^2).                      Please see H&P.                                    COGNITIVE FEATURES THAT CONTRIBUTE TO RISK:  Closed-mindedness and Thought constriction (tunnel vision)    SUICIDE RISK:   Moderate:  Frequent suicidal ideation with limited intensity, and duration, some specificity in terms of plans, no associated intent, good self-control, limited dysphoria/symptomatology, some risk factors present, and identifiable protective factors, including available and accessible social support.  PLAN OF CARE: Please see H&P.   Medical Decision Making:  Review of Psycho-Social Stressors (1), Review or order clinical lab tests (1), Decision to obtain old records (1), Review and summation of old records (2), Established Problem, Worsening (2), Review of Last Therapy Session (1), Review of Medication Regimen & Side Effects (2) and Review of New Medication or Change in Dosage (2)  I certify that inpatient services furnished can reasonably be expected to improve the patient's condition.   Tanisha Lutes MD 04/28/2015, 10:13 AM

## 2015-04-28 NOTE — Plan of Care (Signed)
Problem: Alteration in mood Goal: LTG-Patient reports reduction in suicidal thoughts (Patient reports reduction in suicidal thoughts and is able to verbalize a safety plan for whenever patient is feeling suicidal)  Outcome: Progressing Patient currently denies suicidal thoughts.     

## 2015-04-28 NOTE — [facility] Group Notes (Signed)
[facility] Group Notes:  (Clinical Social Work)  04/28/2015  [facility] Group Notes:  (Clinical Social Work)  04/28/2015  11:00AM-12:00PM  Summary of Progress/Problems:  The main focus of today's process group was to listen to a variety of genres of music and to identify that different types of music provoke different responses.  The patient then was able to identify personally what was soothing for them, as well as energizing.    The patient expressed understanding of concepts, as well as knowledge of how each type of music affected her and how this can be used at home as a wellness/recovery tool.  The pt stated she felt "okay" before the group started and listened to all the music with interest, singing along with several songs.  She still felt "okay" at the end of group.  Type of Therapy:  Music Therapy   Participation Level:  Active  Participation Quality:  Attentive  Affect:  Blunted  Cognitive:  Oriented  Insight:  Engaged  Engagement in Therapy:  Engaged  Modes of Intervention:   Activity, Exploration  Ambrose Mantle, LCSW 04/28/2015

## 2015-04-28 NOTE — Progress Notes (Signed)
Writer spoke with patient 1:1 and she reports that she is fine, denies si/hi/a/v hallucinations. She requested some water and returned to her room. She tends to isolate to her room and has little conversation for Clinical research associate. Safety maintained on unit with 15 min checks.

## 2015-04-28 NOTE — [facility] Group Notes (Signed)
[facility] Group Notes:  (Nursing/MHT/Case Management/Adjunct)  Date:  04/28/2015  Time:  11:14 AM  Type of Therapy:  Nurse Education  Participation Level:  Minimal  Participation Quality:  Inattentive  Affect:  Flat  Cognitive:  Lacking  Insight:  None  Engagement in Group:  None  Modes of Intervention:  Discussion and Education  Summary of Progress/Problems: Patient came to group but was constantly walking in and out of group. Patient did sit long enough to receive daily booklet.   Grace Mack E 04/28/2015, 11:14 AM

## 2015-04-28 NOTE — [facility] Counselor (Signed)
CSW was not able to get pt to sit down long enough to do PSA - was pacing hall, disheveled, agitated.  Ambrose Mantle, LCSW 04/28/2015, 4:41 PM

## 2015-04-28 NOTE — Progress Notes (Signed)
Did not attend group 

## 2015-04-28 NOTE — Plan of Care (Signed)
Problem: Alteration in mood Goal: STG-Patient reports thoughts of self-harm to staff Outcome: Progressing Patient denies SI at this time.      
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
## 2015-04-29 DIAGNOSIS — F333 Major depressive disorder, recurrent, severe with psychotic symptoms: Principal | ICD-10-CM

## 2015-04-29 MED ORDER — BENZTROPINE MESYLATE 0.5 MG PO TABS
0.5000 mg | ORAL_TABLET | Freq: Two times a day (BID) | ORAL | Status: DC
  Administered 2015-04-29: 0.5 mg via ORAL
  Filled 2015-04-29 (×5): qty 1
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
## 2015-04-29 NOTE — Progress Notes (Signed)
Recreation Therapy Notes  [phone] @ approximately 2:45pm Per MD order LRT met with patient to encourage patient to leave unit for general recreation time. Patient initially receptive to LRT, accepting her with pleasant affect. Patient showed some paranoia asking why the physician would think she needs to leave the unit. LRT assured patient that is was simply a suggestion. Patient at this time covered her mouth with her hand and peered out at LRT. Due to patient change in affect and concern about why MD would encourage patient to leave unit LRT chose to encourage patient to leave unit on her own and informed patient she would return tomorrow to go outside with her if needed. Patient agreeable.   Laureen Ochs Domenic Schoenberger, LRT/CTRS  Lennix Kneisel L 04/29/2015 3:55 PM

## 2015-04-29 NOTE — Plan of Care (Signed)
Problem: Ineffective individual coping Goal: STG: Patient will remain free from self harm Outcome: Progressing Patient is remaining free from self harm at this time. Patient denies SI
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
## 2015-04-29 NOTE — BHH Group Notes (Signed)
BHH Group Notes:  (Counselor/Nursing/MHT/Case Management/Adjunct)  04/29/2015 1:15PM  Type of Therapy:  Group Therapy  Participation Level:  Active  Participation Quality:  Appropriate  Affect:  Flat  Cognitive:  Oriented  Insight:  Improving  Engagement in Group:  Limited  Engagement in Therapy:  Limited  Modes of Intervention:  Discussion, Exploration and Socialization  Summary of Progress/Problems: The topic for group was balance in life.  Pt participated in the discussion about when their life was in balance and out of balance and how this feels.  Pt discussed ways to get back in balance and short term goals they can work on to get where they want to be.  Grace Mack came and stayed the entire time.   She was engaged throughout.  She shared that she is resilient because she is springing back after a divorce, and is also reinventing herself by taking classes and moving out of the old house where she and her husband lived together.  She was tuned in to what others were saying, and gave them support and reassurance as appropriate.   Daryel Gerald B 04/29/2015 4:28 PM

## 2015-04-29 NOTE — Progress Notes (Signed)
[facility] MD Progress Note  04/29/2015 10:44 AM Grace Mack  MRN:  [id number] Subjective:  Pt states: "I'm doing much better on the medications. Just a little shaky. I do feel like they are working. I'm not really depressed or anxious right now, but maybe a little bit".   Objective: Pt seen and chart reviewed. Pt is alert/oriented x 4, calm, cooperative, and appropriate to situation. Pt states that she feels her medication is effective and that her sleep and appetite have also improved greatly. She denies suicidal/homicidal ideation and psychosis and does not appear to be responding to internal stimuli.   Pt denies any symptoms related to her CAP and reports that she is breathing well; denies chest pain, substernal pain, cough, wheezing, etc.   Principal Problem: MDD (major depressive disorder), recurrent, severe, with psychosis (HCC) Diagnosis:   Patient Active Problem List   Diagnosis Date Noted  . CAP (community acquired pneumonia) [J18.9] 04/28/2015  . MDD (major depressive disorder), recurrent, severe, with psychosis (HCC) [F33.3] 04/28/2015  . Uterus biforus [Q51.3] 01/12/2015  . Essential (primary) hypertension [I10] 12/24/2012  . Anemia, iron deficiency [D50.9] 06/23/2012  . Chronic hepatitis B with hepatic coma (HCC) [B18.1] 06/22/2012   Total Time spent with patient: 15 minutes  Past Psychiatric History: SEE H&P  Past Medical History:  Past Medical History  Diagnosis Date  . Hypertension   . Anxiety     Past Surgical History  Procedure Laterality Date  . Cervical biopsy     Family History:  Family History  Problem Relation Age of Onset  . CAD Father    Family Psychiatric  History: SEE H&P Social History:  History  Alcohol Use No     History  Drug Use No    Social History   Social History  . Marital Status: Married    Spouse Name: N/A  . Number of Children: N/A  . Years of Education: N/A   Social History Main Topics  . Smoking status: Never Smoker   .  Smokeless tobacco: Never Used  . Alcohol Use: No  . Drug Use: No  . Sexual Activity: No   Other Topics Concern  . None   Social History Narrative   Additional Social History:    History of alcohol / drug use?: No history of alcohol / drug abuse                    Sleep: Good  Appetite:  Good  Current Medications: Current Facility-Administered Medications  Medication Dose Route Frequency Provider Last Rate Last Dose  . acetaminophen (TYLENOL) tablet 650 mg  650 mg Oral Q6H PRN Charm Rings, NP      . alum & mag hydroxide-simeth (MAALOX/MYLANTA) 200-200-20 MG/5ML suspension 30 mL  30 mL Oral Q4H PRN Charm Rings, NP      . docusate sodium (COLACE) capsule 100 mg  100 mg Oral BID Jomarie Longs, MD   100 mg at 04/29/15 0812  . feeding supplement (ENSURE ENLIVE) (ENSURE ENLIVE) liquid 237 mL  237 mL Oral TID BM Saramma Eappen, MD   237 mL at 04/29/15 0812  . hydrochlorothiazide (MICROZIDE) capsule 12.5 mg  12.5 mg Oral Daily Charm Rings, NP   12.5 mg at 04/29/15 7829  . levofloxacin (LEVAQUIN) tablet 750 mg  750 mg Oral Daily Jomarie Longs, MD   750 mg at 04/29/15 0810  . magnesium hydroxide (MILK OF MAGNESIA) suspension 30 mL  30 mL Oral Daily PRN Catha Nottingham  Kennyth Lose, NP      . risperiDONE (RISPERDAL M-TABS) disintegrating tablet 2 mg  2 mg Oral BID Charm Rings, NP   2 mg at 04/27/15 1702  . venlafaxine XR (EFFEXOR-XR) 24 hr capsule 75 mg  75 mg Oral Q breakfast Charm Rings, NP   75 mg at 04/29/15 2841    Lab Results:  Results for orders placed or performed during the hospital encounter of 04/27/15 (from the past 48 hour(s))  Urinalysis with microscopic     Status: Abnormal   Collection Time: 04/28/15  7:00 PM  Result Value Ref Range   Color, Urine YELLOW YELLOW   APPearance CLEAR CLEAR   Specific Gravity, Urine 1.011 1.005 - 1.030   pH 6.5 5.0 - 8.0   Glucose, UA NEGATIVE NEGATIVE mg/dL   Hgb urine dipstick LARGE (A) NEGATIVE   Bilirubin Urine NEGATIVE  NEGATIVE   Ketones, ur NEGATIVE NEGATIVE mg/dL   Protein, ur NEGATIVE NEGATIVE mg/dL   Urobilinogen, UA 1.0 0.0 - 1.0 mg/dL   Nitrite NEGATIVE NEGATIVE   Leukocytes, UA SMALL (A) NEGATIVE   WBC, UA 7-10 <3 WBC/hpf   RBC / HPF 0-2 <3 RBC/hpf   Bacteria, UA RARE RARE   Squamous Epithelial / LPF MANY (A) RARE    Comment: Performed at [facility]    Physical Findings: AIMS: Facial and Oral Movements Muscles of Facial Expression: None, normal Lips and Perioral Area: None, normal Jaw: None, normal Tongue: None, normal,Extremity Movements Upper (arms, wrists, hands, fingers): None, normal Lower (legs, knees, ankles, toes): None, normal, Trunk Movements Neck, shoulders, hips: None, normal, Overall Severity Severity of abnormal movements (highest score from questions above): None, normal Incapacitation due to abnormal movements: None, normal Patient's awareness of abnormal movements (rate only patient's report): No Awareness, Dental Status Current problems with teeth and/or dentures?: No Does patient usually wear dentures?: No  CIWA:    COWS:     Musculoskeletal: Strength & Muscle Tone: mildly spastic Gait & Station: normal Patient leans: N/A  Psychiatric Specialty Exam: Review of Systems  Respiratory: Negative for cough, shortness of breath and wheezing.   Psychiatric/Behavioral: Positive for depression. Negative for suicidal ideas and hallucinations. The patient is nervous/anxious and has insomnia.   All other systems reviewed and are negative.   Blood pressure 124/73, pulse 84, temperature 98.1 F (36.7 C), temperature source Oral, resp. rate 20, height 5\' 4"  (1.626 m), weight 69.854 kg (154 lb), last menstrual period 04/07/2015, SpO2 100 %.Body mass index is 26.42 kg/(m^2).  General Appearance: Casual and Fairly Groomed  Patent attorney::  Good  Speech:  Clear and Coherent and Normal Rate  Volume:  Normal  Mood:  Anxious and Depressed  Affect:  Appropriate  and Congruent  Thought Process:  Coherent and Goal Directed  Orientation:  Full (Time, Place, and Person)  Thought Content:  WDL  Suicidal Thoughts:  No  Homicidal Thoughts:  No  Memory:  Immediate;   Fair Recent;   Fair Remote;   Fair  Judgement:  Fair  Insight:  Fair  Psychomotor Activity:  Normal  Concentration:  Fair  Recall:  [employer] of Knowledge:Fair  Language: Fair  Akathisia:  NA  Handed:    AIMS (if indicated):     Assets:  Desire for Improvement Resilience Social Support  ADL's:  Intact  Cognition: WNL  Sleep:  Number of Hours: 0   Treatment Plan Summary: Daily contact with patient to assess and evaluate symptoms and progress  in treatment and Medication management  Medications:  -Continue Risperidone  bid for psychosis -Continue Effexor  daily for mood stabilization, MDD -Start cogentin 0.5mg  bid for EPS prophylaxis -Continue Levaquin  daily per hospitalist for CAP  Beau Fanny, FNP-BC 04/29/2015, 10:44 AM Agree with NP Progress Note, as above  Nehemiah Massed, MD

## 2015-04-29 NOTE — Progress Notes (Signed)
Patient ID: Grace Mack, female   DOB: May 13, 1966, 49 y.o.   MRN: [id number]  DAR: Pt. Denies SI/HI and A/V Hallucinations. She reports her sleep was fair, appetite is good, energy level is normal, and concentration is good. She rates her depression 0/10, hopelessness 0/10, and anxiety 9/10. Patient does not report any pain or discomfort at this time. She has still not had a BM and reports she does not remember the last time she did however continues to refuse medication other than her scheduled Colace. Support and encouragement provided to the patient. Scheduled medications administered to patient per physician's orders. Patient remains minimal and guarded. However is seen in the milieu more today. Q15 minute checks are maintained for safety.

## 2015-04-30 LAB — URINE CULTURE

## 2015-04-30 LAB — PROLACTIN: PROLACTIN: 45.2 ng/mL — AB (ref 4.8–23.3)

## 2015-04-30 MED ORDER — ARIPIPRAZOLE ER 400 MG IM SUSR: 400.0000 mg | INTRAMUSCULAR | Status: DC

## 2015-04-30 NOTE — Progress Notes (Signed)
D: Pt is isolative and withdrawn to self. Pt who is paranoid and confused completely denies any form of depression, anxiety, pain, SI, HI and AVH. She states, "Everything is fine." Pt continues to be nonviolent  A: Medications offered as prescribed.  Support, encouragement, and safe environment provided.  15-minute safety checks continue.  R: Pt refused schedule ensure med compliant.  Pt did not attend wrap-up group. Safety checks continue
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
## 2015-04-30 NOTE — [facility] Group Notes (Signed)
[facility] Group Notes:  (Nursing/MHT/Case Management/Adjunct)  Date:  04/30/2015  Time: 0930  Type of Therapy:  Nurse Education  Participation Level:  Minimal  Participation Quality:  Appropriate  Affect:  Blunted  Cognitive:  Appropriate  Insight:  Lacking  Engagement in Group:  Lacking  Modes of Intervention:  Education and Support  Summary of Progress/Problems: Patient attended nursing led group on the recovery process as it relates to mental health. Patient left the group for a short period of time and then returned. Patient seemed preoccupied with thoughts while in group.  Grace Mack Grace Mack 04/30/2015, 4:31 PM

## 2015-04-30 NOTE — Progress Notes (Signed)
Recreation Therapy Notes  10.11.2016 @ approximately 2:15pm LRT returned to attempt to get patient off unit per MD order. Patient again appeared suspicious of LRT and stated she would rather wait for entire unit to go to general recreation. Patient asked LRT to clarify location of general recreation, which LRT advised would depend on weather. LRT advised patient she would return tomorrow to attempt to take her off unit individually, patient agreed. Patient made no eye contact with LRT during 1:1 interaction and spoke in a soft, nearly inaudible voice.   [name] Ravi Tuccillo, LRT/CTRS  Jeanene Mena L 04/30/2015 3:13 PM

## 2015-04-30 NOTE — [facility] Group Notes (Signed)
[facility] Group Notes:  (Counselor/Nursing/MHT/Case Management/Adjunct)  04/30/2015 1:15PM  Type of Therapy:  Group Therapy  Participation Level:  Active  Participation Quality:  Appropriate  Affect:  Flat  Cognitive:  Oriented  Insight:  Improving  Engagement in Group:  Limited  Engagement in Therapy:  Limited  Modes of Intervention:  Discussion, Exploration and Socialization  Summary of Progress/Problems: The topic for group was balance in life.  Pt participated in the discussion about when their life was in balance and out of balance and how this feels.  Pt discussed ways to get back in balance and short term goals they can work on to get where they want to be.  Grace Mack stayed for the entire group.  Was engaged throughout.  Minimal participation.  States she feels balanced today "because I am not too tired."  She finds balance by staying positive and giving herself positive affirmations.  "It's gonna get better."    [name] 04/30/2015 2:58 PM

## 2015-04-30 NOTE — Plan of Care (Signed)
Problem: Alteration in thought process Goal: STG-Patient is able to discuss thoughts with staff Outcome: Not Progressing Patient declines to discuss thoughts or emotions with staff.

## 2015-04-30 NOTE — Progress Notes (Signed)
D: Patient alert and oriented x4. Patient denies SI/HI/AVH. Patients affect is flat and facial expression blank throughout interactions. Patient appears to be thought blocking at times, and avoided eye contact throughout the majority of interactions. Patient was suspicious of water cups during morning med administration, and asked to replace the cup 4 times. Patient refused morning administration of Cogentin and Rispedal. Patient is found frequently in the day room, but minimally interacts with others. Patient rated anxiety of 3 per self inventory, but denies anxiety, depression, and hopelessness when asked. Patient pleasant and cooperative, but does not provide much insight into current mental health status.   A: Provided active listening and support. Discussed patient status with MD and treatment team. Administered scheduled medications. Encouraged patient to attend groups, and to use staff as a resource for support, health education, and concerns. Encouraged patient to follow medication regimen. R: Patient attended nursing led group and social worker group, but declined LRT recreation therapy. Will continue Q15 min. checks. Dr. Elna Breslow discontinued Cogentin and Risperdal.

## 2015-04-30 NOTE — [facility] Group Notes (Signed)
Pt did not attend group this evening. Serrina Minogue Cathey, MHT

## 2015-05-01 MED ORDER — VENLAFAXINE HCL ER 150 MG PO CP24
150.0000 mg | ORAL_CAPSULE | Freq: Every day | ORAL | Status: DC
  Administered 2015-05-02 – 2015-05-06 (×5): 150 mg via ORAL
  Filled 2015-05-01 (×6): qty 1

## 2015-05-01 NOTE — [facility] Group Notes (Signed)
[facility]  05/01/2015 , 2:22 PM    Type of Therapy:  [employer] Presentation  Participation Level:  Active  Participation Quality:  Attentive  Affect:  Blunted  Cognitive:  Oriented  Insight:  Limited  Engagement in Therapy:  Engaged  Modes of Intervention:  Discussion, Education and Socialization  Summary of Progress/Problems:  Grace Mack from [employer] came to present his recovery story and play the guitar.  Came in and out of group several times.  Sat quietly when with us.  Grace Mack, Grace Mack B 05/01/2015 , 2:22 PM

## 2015-05-01 NOTE — Progress Notes (Signed)
Patient ID: Grace Mack, female   DOB: 11/21/1965, 49 y.o.   MRN: [id number]  DAR: Grace Mack. Denies SI/HI and A/V Hallucinations. She reports she slept well last night, appetite is good, energy level is normal, and concentration level is good. She rates her depression, anxiety, and hopelessness at 0/10.  Patient does not report any pain or discomfort at this time. Support and encouragement provided to the patient. Scheduled medications administered to patient per physician's orders. However, does refuse Ensure. Patient remains minimal in interaction. Her responses are delayed and is observed staring off into the distance frequently. She is walking slowly and appears to have thought blocking. Q15 minute checks are maintained for safety.

## 2015-05-01 NOTE — Progress Notes (Signed)
Recreation Therapy Notes  10.12.2016 @ approximately 3:00pm. LRT approached patient in hallway on [address], in contrast to previous interactions patient receptive to LRT and agreed to walk downstairs with LRT so patient could get some fresh air during general recreation time. Patient quickly expressed she was fearful of getting pneumonia, as she already has it, but does not want to get it again. LRT assured patient that if she did not want to stay downstairs LRT would return to unit with her. Patient agreed to go downstairs with LRT. Upon getting to doors of [address] patient stopped and appeared apprehensive about leaving unit, patient began backing away from door. LRT offered patient a jacket, extra socks and a blanket to attempt to entice patient to leave unit, patient stated she was frightened and that she did not want to leave the unit. LRT advised patient she would return following day to attempt to get her outside. Patient agreed.   [name], LRT/CTRS  [name], Grace Mack 05/01/2015 3:43 PM

## 2015-05-01 NOTE — Progress Notes (Signed)
D: Pt continues to be isolative and withdrawn to self. Pt who is paranoid and confused completely denies any form of depression, anxiety, pain, SI, HI and AVH. She states, "I am feeling just fine." Pt continues to be nonviolent  A: Medications offered as prescribed.  Support, encouragement, and safe environment provided.  15-minute safety checks continue.  R: Pt refused schedule ensure med compliant.  Pt did attend wrap-up group. Safety checks continu

## 2015-05-01 NOTE — Plan of Care (Signed)
Problem: Alteration in thought process Goal: STG-Patient is able to follow short directions Outcome: Progressing She is progressing in this area, continues to need some prompting but is able to follow directions with help.
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
## 2015-05-02 DIAGNOSIS — E221 Hyperprolactinemia: Secondary | ICD-10-CM | POA: Clinically undetermined

## 2015-05-02 MED ORDER — TRAZODONE HCL 50 MG PO TABS
25.0000 mg | ORAL_TABLET | Freq: Every evening | ORAL | Status: DC | PRN
  Filled 2015-05-02: qty 1

## 2015-05-02 MED ORDER — ARIPIPRAZOLE 5 MG PO TABS
5.0000 mg | ORAL_TABLET | Freq: Every day | ORAL | Status: DC
  Administered 2015-05-02: 5 mg via ORAL
  Filled 2015-05-02 (×2): qty 1

## 2015-05-02 NOTE — Plan of Care (Signed)
Problem: Ineffective individual coping Goal: STG-Increase in ability to manage activities of daily living Outcome: Not Progressing Pt encouraged to shower, brush teeth and wash face but to no avail. Assistance and support offered. Will continue to provide encouragement.

## 2015-05-02 NOTE — Progress Notes (Signed)
Recreation Therapy Notes  10.13.2016 @ approximately 2:40pm. LRT again attempted to get patient to leave unit for walk around courtyard. Patient received LRT well, but again declined to leave unit with LRT. Patient pleasant to interact with and decline politely. LRT to continue to attempt until patient d/c.   Marykay Lexenise L Deaundra Kutzer, LRT/CTRS  [name], Kendrick Remigio L 05/02/2015 3:39 PM
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
## 2015-05-02 NOTE — Progress Notes (Signed)
D: Pt A & O X4, remains paranoid on interactions. Hesitant but compliant with ordered medications with increased encouragement and verbal prompts. Pt denied SI, HI, AVH and pain when assessed. Pt is guarded with intermittent isolation to her room. Pt attended scheduled groups with minimal interactions.  A: Ordered medications administered as prescribed. Support and availability offered throughout this shift. Pt encouraged to voice concerns and needs. Q 15 minutes checks maintained for safety without self injurious behavior to note at present.  R: Pt compliant with PO medications but refused IM Abilify 400 mg when approached by staff. Pt denies adverse drug reactions when assessed. Safety maintained on and off unit. Plan of care continues.

## 2015-05-02 NOTE — [facility] Group Notes (Signed)
[facility] Group Notes:  (Nursing--Orientation)  Date:  05/02/2015  Time:  0930 Type of Therapy:  Nurse Education  Participation Level:  Minimal  Participation Quality:  Appropriate, Attentive and Sharing  Affect:  Anxious and Flat  Cognitive:  Alert and Oriented  Insight:  Appropriate and Improving  Engagement in Group:  Engaged and Limited  Modes of Intervention:  Education, Discussion and Support  Summary of Progress/Problems:Pt attended scheduled group and was engaged throughout this session.   Sherryl MangesWesseh, Delmer Kowalski 05/02/2015,0930

## 2015-05-02 NOTE — [facility] Suicide Risk Assessment (Addendum)
[facility] INPATIENT:  Family/Significant Other Suicide Prevention Education  Suicide Prevention Education:  Patient Refusal for Family/Significant Other Suicide Prevention Education: The patient Grace Mack has refused to provide written consent for family/significant other to be provided Family/Significant Other Suicide Prevention Education during admission and/or prior to discharge.  Physician notified.    Grace Mack, Grace Mack 05/02/2015, 4:27 PM    SPE reviewed with patient and brochure provided. Patient encouraged to return to hospital if having suicidal thoughts, patient verbalized his/her understanding and has no further questions at this time.  Grace Mack, MSW, [employer]CSWA Clinical Social Worker [facility] [phone]

## 2015-05-02 NOTE — [facility] Group Notes (Signed)
[facility] LCSW Group Therapy  05/02/2015 3:20 PM  Type of Therapy:  Group Therapy  Participation Level:  Invited, did not attend   Modes of Intervention:  Discussion, Exploration, Problem-solving and Support Today's Topic: Overcoming Obstacles. Patients identified one short term goal and potential obstacles in reaching this goal. Patients processed barriers involved in overcoming these obstacles. Patients identified steps necessary for overcoming these obstacles and explored motivation (internal and external) for facing these difficulties head on.    Sallee LangeCunningham, Lyrique Hakim C 05/02/2015, 3:20 PM

## 2015-05-02 NOTE — Progress Notes (Signed)
D   Pt is depressed and sad   She was sleeping on the bench in her room and even after she was given different bed options pt refused to go to the bed   She isolated to her room and did not attend group or socialize with others   Other than not sleeping on the bed pt has been cooperative A   Verbal support given    Medications administered and effectiveness monitored   Q 15 min checks   Cautioned pt about the risks of sleeping on the bench and continue to encourage her to sleep in her bed R    Pt is safe presently

## 2015-05-03 MED ORDER — ARIPIPRAZOLE 15 MG PO TABS
7.5000 mg | ORAL_TABLET | Freq: Every day | ORAL | Status: DC
Start: 2015-05-03 — End: 2015-05-07
  Administered 2015-05-03 – 2015-05-05 (×2): 7.5 mg via ORAL
  Filled 2015-05-03 (×7): qty 1

## 2015-05-03 MED ORDER — LORAZEPAM 0.5 MG PO TABS
0.5000 mg | ORAL_TABLET | Freq: Three times a day (TID) | ORAL | Status: DC | PRN
  Administered 2015-05-03: 0.5 mg via ORAL
  Filled 2015-05-03 (×2): qty 1

## 2015-05-03 MED ORDER — LORAZEPAM 2 MG/ML IJ SOLN: 0.5000 mg | Freq: Three times a day (TID) | INTRAMUSCULAR | Status: DC | PRN

## 2015-05-03 NOTE — [facility] Group Notes (Signed)
[facility] LCSW Group Therapy  05/03/2015 1:38 PM   Type of Therapy:  Group Therapy  Participation Level:  Flat  Participation Quality:  Minimal  Affect:  Constricted  Cognitive:  Appropriate  Insight:  Developing/Improving  Engagement in Therapy:  Engaged  Modes of Intervention:  Discussion, Exploration, Socialization and Support  Summary of Progress/Problems:  Chaplain led group explored concept of hope and its relevance to mental health recovery.  Patients explored themes including what matters to them personally, how others responses are similar/different, and what they are hopeful for.  Group members discussed relevance of social supports, innter strength and using their own stories to craft a recovery path.    Sallee Langeunningham, Breionna Punt C

## 2015-05-03 NOTE — Plan of Care (Signed)
Problem: Ineffective individual coping Goal: STG:Pt. will utilize relaxation techniques to reduce stress STG: Patient will utilize relaxation techniques to reduce stress levels  Outcome: Progressing Patient states she has been attending groups and has learned coping techniques and how to utilized them when she is discharged. Monitoring continues.
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
## 2015-05-03 NOTE — Progress Notes (Signed)
Psychoeducational Group Note  Date:  05/03/2015 Time:  2120  Group Topic/Focus:  Wrap-Up Group:   The focus of this group is to help patients review their daily goal of treatment and discuss progress on daily workbooks.  Participation Level: Did Not Attend  Participation Quality:  Not Applicable  Affect:  Not Applicable  Cognitive:  Not Applicable  Insight:  Not Applicable  Engagement in Group: Not Applicable  Additional Comments:  The patient did not attend group this evening.    Iyesha Such S 05/03/2015, 9:20 PM

## 2015-05-03 NOTE — Progress Notes (Signed)
Patient ID: Grace Mack, female   DOB: 05/04/1966, [age] y.o.   MRN: [id number] D: Patient observed on unit in dayroom with peers. Patient without any concerns voiced tonight. She states she is getting better. Patient did not do a lot of interacting with this writer she simple answers and responses to questions. A: Patient encouraged to continue to take medications as ordered. To seek the attention of her doctor when she feels medication is not working or doesn't want to take medications. Patient stated she enjoyed group meetings and she was getting a lot of information on coping when she gets discharged.  R: Patient allowed to express her feelings and encouraged to ask questions if any about medication and coping when she is discharged. Q 15 minute checks maintained and progress. Patient denies SI, HI and AVH at this time. Monitoring continues.
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
## 2015-05-03 NOTE — [facility] Group Notes (Signed)
[facility]   05/03/2015 3:52 PM  Participation Quality:  Flat, minimal   Mood/Affect:  Blunted  Depression Rating:  0  Anxiety Rating:  5  Thoughts of Suicide:  No Will you contract for safety?   NA  Current AVH:  patient denies  Plan for Discharge/Comments:  Follow up w [facility], live w mother while recuperating  Transportation Means: family members  Supports:  family  Sallee LangeCunningham, Mara Favero C

## 2015-05-03 NOTE — Progress Notes (Signed)
D: Patient is A&O, denies SI/HI. Patient would not answer if she was having A/V hallucinations. Patient is cooperative but is acting bizarre, asking staff questions, then telling staff to hold on a minute, walks a few feet away, stops and stares at the wall until staff redirects her. Patient states she slept well but her concentration is poor. Patient is pacing in the halls at time, but not interacting with peers. Patient is not going to groups. Rates her depression as 0, hopelessness as a 0, and anxiety as a 5.  A: Medications given as scheduled and prn. Emotional support given as needed. Continue q15 minute checks for safety.  R: Patient remains safe. Patient verbalizes understanding to find staff if she no longer feels she can contract for safety.  Georgeann Brinkman, Wyman SongsterAngela Marie, RN

## 2015-05-03 NOTE — Progress Notes (Signed)
The patient attended last evening's Karaoke group and was appropriately behaved.  

## 2015-05-04 NOTE — [facility] Group Notes (Signed)
[facility] Group Notes:  (Clinical Social Work)  05/04/2015  11:15-12:00PM  Summary of Progress/Problems:   The main focus of today's process group was to discuss patients' feelings related to being hospitalized.  It was agreed in general by the group that it would be preferable to avoid future hospitalizations, and we discussed means of doing that.  As a follow-up, problems with adhering to medication recommendations were discussed.  The patient expressed her primary feeling about being hospitalized is "okay" and it was very difficult to get her to answer questions throughout group.  Type of Therapy:  Group Therapy - Process  Participation Level:  Minimal  Participation Quality:  Inattentive  Affect:  Flat  Cognitive:  Disorganized  Insight:  Poor  Engagement in Therapy:  Poor  Modes of Intervention:  Exploration, Discussion  Ambrose MantleMareida Grossman-Orr, LCSW 05/04/2015, 12:52 PM

## 2015-05-04 NOTE — Progress Notes (Signed)
The focus of this group is to educate the patient on the purpose and policies of crisis stabilization and provide a format to answer questions about their admission.  The group details unit policies and expectations of patients while admitted.  Patient attended 0900 nurse education orientation group this morning.  Patient listened at times, no participation, and with minimal affect.  Patient was alert.  Patient had minimal   insight with no engagement.  Today patient will work on 3 goals for discharge.

## 2015-05-04 NOTE — Progress Notes (Signed)
The focus of this group is to help patients review their daily goal of treatment and discuss progress on daily workbooks. Pt did not attend the evening group. 
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
## 2015-05-04 NOTE — Progress Notes (Signed)
D: Pt continues to be isolative and withdrawn to self. Pt continues to be slow, paranoid and confused. Pt denies depression, anxiety, pain, SI, and HI. Pt however endorses auditory hallucination. She states, "The lord was talking to me; asked me to read my bible." Pt was observed reading the bible for about an hour. Pt continues to be nonviolent  A: Medications offered as prescribed.  Support, encouragement, and safe environment provided.  15-minute safety checks continue.  R: Pt was med compliant.  Pt did attend wrap-up group. Safety checks continue

## 2015-05-04 NOTE — Progress Notes (Signed)
D:  Patient's self inventory sheet, patient sleeps good, no sleep medication administered.  Good appetite, normal energy level, good concentration.  Denied depression and hopeless, rated anxiety #5.  Denied withdrawals  Denied SI.  Denied physical problems.  Denied pain.  Does have discharge plans. A:  Medication administered per MD orders.  Emotional support and encouragement given patient. R:  Denied SI and HI, contracts for safety.  Denied A/V hallucinations.  Safety maintained with 15 minute checks.

## 2015-05-05 NOTE — Progress Notes (Signed)
Did not attend group 

## 2015-05-05 NOTE — [facility] Group Notes (Signed)
[facility] Group Notes:  (Clinical Social Work)  05/05/2015  [facility] Group Notes:  (Clinical Social Work)  05/05/2015  11:00AM-12:00PM  Summary of Progress/Problems:  The main focus of today's process group was to listen to a variety of genres of music and to identify that different types of music provoke different responses.  The patient then was able to identify personally what was soothing for them, as well as energizing.    The patient expressed understanding of concepts, and was able to identify music that bothered her, repeatedly asked for music to be turned off by raising her hand to acknowledge her discomfort, for which she received positive feedback from CSW.  She also was in and out of the room at last 4 times.  Type of Therapy:  Music Therapy   Participation Level:  Active  Participation Quality:  Attentive  Affect:  Flat and Depressed and Anxious  Cognitive:  Disorganized  Insight:  Improving  Engagement in Therapy:  Improving  Modes of Intervention:   Activity, Exploration  Ambrose MantleMareida Grossman-Orr, LCSW 05/05/2015

## 2015-05-05 NOTE — Progress Notes (Signed)
Bonny has been visible on the unit.  She remains very quiet but does answer questions with noticeable thought blocking.  She denies any SI/HI or A/V hallucinations.  She has attended all groups this morning.  She completed her self inventory and reports that her depression and hopelessness are 0/10 and her anxiety is 7/10.  She didn't put a goal for the day and denies any concerns at this time.  She took her medications without difficulty.  Encouraged continued participation in group and unit activities.  She likes to stay in her room alone but denies any issues to discuss at this time.  Q 15 minute checks maintained for safety.  We will continue to monitor the progress toward her goals.

## 2015-05-05 NOTE — Progress Notes (Signed)
D: Pt denies SI/HI/AVH. Pt is pleasant and cooperative. Pt appears paranoid, pt stated she wants [place] to her moms house when she leaves. Pt said "everything is ok". Pt paranoid about roommate. Pt concerned about roommat coming in and out.   A: Pt was offered support and encouragement. Pt was given scheduled medications. Pt was encourage to attend groups. Q 15 minute checks were done for safety.   R:Pt attends groups and interacts well with peers and staff. Pt is taking medication. Pt has no complaints at this time .Pt receptive to treatment and safety maintained on unit.
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
## 2015-05-05 NOTE — [facility] Group Notes (Signed)
[facility] Group Notes:  (Nursing/MHT/Case Management/Adjunct)  Date:  05/05/2015  Time:  11:15 AM  Type of Therapy:  Nurse Education  Participation Level:  Minimal  Participation Quality:  Sharing  Affect:  Flat  Cognitive:  Appropriate  Insight:  Limited  Engagement in Group:  Developing/Improving  Modes of Intervention:  Discussion and Education  Summary of Progress/Problems:  Group topic for today was Health Support Systems.  Lucy reports that her family and friends are her support people but wouldn't elaborate as to who specifically.  She was able to participate in group but noticeable thought blocking when answering questions.    Norm ParcelHeather V Danya Spearman 05/05/2015, 11:15 AM

## 2015-05-05 NOTE — Progress Notes (Signed)
Pt is labile; was seen laughing, walking and talking with family member during the visiting hours, to a withdrawn, completely mute states as if she became extremely sad because her family left. Pt in bed would not acknowledge nor answer any questions from the nurse. Pt does not look to be in any distress. Support, encouragement, and safe environment provided. Pt refused 2200 medication only by head gesture. Pt did not attend wrap-up group. Safety checks continue. 15-minute safety checks continue.

## 2015-05-05 NOTE — Plan of Care (Signed)
Problem: Alteration in mood Goal: LTG-Patient reports reduction in suicidal thoughts (Patient reports reduction in suicidal thoughts and is able to verbalize a safety plan for whenever patient is feeling suicidal)  Outcome: Progressing Pt denies SI at this time     

## 2015-05-05 NOTE — Plan of Care (Signed)
Problem: Ineffective individual coping Goal: STG: Patient will remain free from self harm Outcome: Progressing Grace Mack denies any suicidal thoughts today.  She was able to report that during group time.

## 2015-05-06 MED ORDER — VENLAFAXINE HCL ER 75 MG PO CP24
225.0000 mg | ORAL_CAPSULE | Freq: Every day | ORAL | Status: DC
  Administered 2015-05-07: 225 mg via ORAL
  Filled 2015-05-06 (×3): qty 3

## 2015-05-06 NOTE — [facility] Group Notes (Signed)
   [facility]  05/06/2015  8:45 AM   Participation Quality: Alert, Appropriate and Oriented  Mood/Affect: Flat, anxious  Depression Rating: 0  Anxiety Rating:  10  Thoughts of Suicide: Pt denies SI/HI  Will you contract for safety? Yes  Current AVH: Pt denies  Plan for Discharge/Comments: Pt attended discharge planning group and actively participated in group. CSW provided pt with today's workbook. Patient reports feeling "okay" today. She plans to live with her mother at discharge and follow up with outpatient services at discharge.   Transportation Means: Pt reports access to transportation  Supports: No supports mentioned at this time  Samuella BruinKristin Elsy Chiang, MSW, [employer]CSWA Clinical Social Worker [facility] [phone]

## 2015-05-06 NOTE — Plan of Care (Signed)
Problem: Alteration in mood Goal: LTG-Patient reports reduction in suicidal thoughts (Patient reports reduction in suicidal thoughts and is able to verbalize a safety plan for whenever patient is feeling suicidal)  Outcome: Progressing Pt denies SI at this time     

## 2015-05-06 NOTE — Plan of Care (Signed)
Problem: Alteration in mood Goal: LTG-Patient reports reduction in suicidal thoughts (Patient reports reduction in suicidal thoughts and is able to verbalize a safety plan for whenever patient is feeling suicidal)  Outcome: Progressing Grace Mack denies any suicidal thoughts today.

## 2015-05-06 NOTE — [facility] Group Notes (Signed)
[facility] LCSW Group Therapy 05/06/2015  1:15 PM   Type of Therapy: Group Therapy  Participation Level: Did Not Participate. Patient left at the beginning of group without participating and did not return.   Samuella BruinKristin Cabot Cromartie, MSW, [employer] Clinical Social Worker [facility] [phone]

## 2015-05-06 NOTE — Progress Notes (Signed)
DAR Note: Grace Mack has been visible on the unit.  She attended morning groups.  She denies any SI/HI or A/V hallucinations.  She has noticeable thought blocking. She took her medications without problems.  She remains guarded and didn't let staff take vitals this morning.  She c/o of her "heart stopping" but was able to get her to have her blood pressure taken and it was WDL.  She denies any other physical complaints and appears to be in no physical distress. She went outside with the group and was noted playing basketball with staff.  She remains quiet and no interaction with peers.  She completed her self inventory and reports that her depression and hopelessness are 0/10 and her anxiety is 10/10.  She did not state what her goal for today would be.   Encouraged continued participation in group and unit activities.  Q 15 minute checks maintained for safety.
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
## 2015-05-06 NOTE — Progress Notes (Signed)
D:Pt continues to be paranoid. Pt could not sleep in room with roommate due to her being paranoid, pt slept in the quiet room. Pt refused her night medication due to her being scared. Pt still has issue going in room when roommate is in there.   Pt denies SI/HI/AVH.  A: Pt was offered support and encouragement. Pt refused scheduled night medications. Pt was encourage to attend groups. Q 15 minute checks were done for safety.   R:Pt attends groups and interacts well with peers and staff. Pt is taking medication .Pt receptive to treatment and safety maintained on unit.

## 2015-05-07 MED ORDER — VENLAFAXINE HCL ER 75 MG PO CP24: 225.0000 mg | ORAL_CAPSULE | Freq: Every day | ORAL | Status: AC

## 2015-05-07 MED ORDER — ARIPIPRAZOLE 15 MG PO TABS: 7.5000 mg | ORAL_TABLET | Freq: Every day | ORAL | Status: AC

## 2015-05-07 MED ORDER — FERROUS SULFATE 325 (65 FE) MG PO TABS: 325.0000 mg | ORAL_TABLET | Freq: Every day | ORAL | Status: AC

## 2015-05-07 MED ORDER — HYDROCHLOROTHIAZIDE 25 MG PO TABS: 12.5000 mg | ORAL_TABLET | Freq: Every day | ORAL | Status: AC

## 2015-05-07 NOTE — Progress Notes (Signed)
Patient ID: Grace Mack, female   DOB: 12/17/1965, 49 y.o.   MRN: [id number]   Pt discharged home with her mother. Pt was stable and appreciative at that time. All papers and prescriptions were given and valuables returned. Verbal understanding expressed. Denies SI/HI and A/VH. Pt given opportunity to express concerns and ask questions.

## 2015-05-07 NOTE — Progress Notes (Signed)
  [facility] :  Will you be returning to the same living situation after discharge:  Patient will return to her residence At discharge, do you have transportation home?: Yes,  patient reports access to transportation home Do you have the ability to pay for your medications: Yes,  patient will be provided with prescriptions at discharge  Release of information consent forms completed and in the chart;  Patient's signature needed at discharge.  Patient to Follow up at: Follow-up Information    Follow up with [facility] On 05/13/2015.   Why:   Monday at 4pm with Barbette Merinoarolyn McDonald, NP   Contact information:   [address]   [place]  [place] Phone:  [phone] Fax:        Follow up with outpatient PCP.   Why:  Need to follow up as an outpatient as you just finished a course of antibiotics for community acquired pneumonia      Patient denies SI/HI: Yes,  denies    Aeronautical engineerafety Planning and Suicide Prevention discussed: Yes,  with patient  Have you used any form of tobacco in the last 30 days? (Cigarettes, Smokeless Tobacco, Cigars, and/or Pipes): No  Has patient been referred to the Quitline?: N/A patient is not a smoker  Grace Mack, Belenda CruiseKristin L 05/07/2015, 1:09 PM

## 2015-05-07 NOTE — Progress Notes (Signed)
Pt observed standing outside nurses station due to her roommate in the room. Pt too paranoid to go in the room.
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
## 2015-05-07 NOTE — Discharge Summary (Signed)
Physician Discharge Summary Note  Patient:  Grace Mack is an 49 y.o., female MRN:  [id number] DOB:  1965/11/19 Patient phone:  [phone] (home)  Patient address:   [address] [place] [address],  Total Time spent with patient: 30 minutes  Date of Admission:  04/27/2015 Date of Discharge: 05/07/2015  Reason for Admission:  Depression  Principal Problem: MDD (major depressive disorder), recurrent, severe, with psychosis [facility]) Discharge Diagnoses: Patient Active Problem List   Diagnosis Date Noted  . 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 CAP (community acquired pneumonia) [J18.9] 04/28/2015  . MDD (major depressive disorder), recurrent, severe, with psychosis (HCC) [F33.3] 04/28/2015  . Uterus biforus [Q51.3] 01/12/2015  . Essential (primary) hypertension [I10] 12/24/2012  . Anemia, iron deficiency [D50.9] 06/23/2012  . Chronic hepatitis B with hepatic coma (HCC) [B18.1] 06/22/2012    Musculoskeletal: Strength & Muscle Tone: within normal limits Gait & Station: normal Patient leans: N/A  Psychiatric Specialty Exam:  SEE MD SRA Physical Exam  Vitals reviewed. Psychiatric: Her mood appears anxious. She is not agitated. Thought content is not paranoid and not delusional. She expresses no homicidal and no suicidal ideation. She expresses no suicidal plans and no homicidal plans.    Review of Systems  Psychiatric/Behavioral: Negative for depression, suicidal ideas and hallucinations.  All other systems reviewed and are negative.   Blood pressure 120/82, pulse 82, temperature 98 F (36.7 C), temperature source Oral, resp. rate 18, height  (1.626 m), weight 69.854 kg (154 lb), last menstrual period 04/07/2015, SpO2 100 %.Body mass index is 26.42 kg/(m^2).  Have you used any form of tobacco in the last 30 days? (Cigarettes, Smokeless Tobacco, Cigars, and/or Pipes): No  Has this patient used any form of tobacco in the last 30 days? (Cigarettes, Smokeless  Tobacco, Cigars, and/or Pipes) N/A  Past Medical History:  Past Medical History  Diagnosis Date  . Hypertension   . Anxiety     Past Surgical History  Procedure Laterality Date  . Cervical biopsy     Family History:  Family History  Problem Relation Age of Onset  . CAD Father    Social History:  History  Alcohol Use No     History  Drug Use No    Social History   Social History  . Marital Status: Married    Spouse Name: N/A  . Number of Children: N/A  . Years of Education: N/A   Social History Main Topics  . Smoking status: Never Smoker   . Smokeless tobacco: Never Used  . Alcohol Use: No  . Drug Use: No  . Sexual Activity: No   Other Topics Concern  . None   Social History Narrative   Risk to Self:   Risk to Others:   Prior Inpatient Therapy:   Prior Outpatient Therapy:    Level of Care:  OP  Hospital Course:  Grace Mack was admitted for MDD (major depressive disorder), recurrent, severe, with psychosis (HCC) and crisis management.  She was treated discharged with the medications listed below under Medication List.  Medical problems were identified and treated as needed.  Home medications were restarted as appropriate.  Improvement was monitored by observation and Grace Mack daily report of symptom reduction.  Emotional and mental status was monitored by daily self-inventory reports completed by Grace Mack and clinical staff.         Grace Mack was evaluated by the treatment team for stability and plans for continued  recovery upon discharge.  Grace Mack motivation was an integral factor for scheduling further treatment.  Employment, transportation, bed availability, health status, family support, and any pending legal issues were also considered during her hospital stay.  She was offered further treatment options upon discharge including but not limited to Residential, Intensive Outpatient, and Outpatient treatment.  Grace Mack will  follow up with the services as listed below under Follow Up Information.     Upon completion of this admission the patient was both mentally and medically stable for discharge denying suicidal/homicidal ideation, auditory/visual/tactile hallucinations, delusional thoughts and paranoia.      Consults:  psychiatry  Significant Diagnostic Studies:  labs: per ED  Discharge Vitals:   Blood pressure 120/82, pulse 82, temperature 98 F (36.7 C), temperature source Oral, resp. rate 18, height 5\' 4"  (1.626 m), weight 69.854 kg (154 lb), last menstrual period 04/07/2015, SpO2 100 %. Body mass index is 26.42 kg/(m^2). Lab Results:   No results found for this or any previous visit (from the past 72 hour(s)).  Physical Findings: AIMS: Facial and Oral Movements Muscles of Facial Expression: None, normal Lips and Perioral Area: None, normal Jaw: None, normal Tongue: None, normal,Extremity Movements Upper (arms, wrists, hands, fingers): None, normal Lower (legs, knees, ankles, toes): None, normal, Trunk Movements Neck, shoulders, hips: None, normal, Overall Severity Severity of abnormal movements (highest score from questions above): None, normal Incapacitation due to abnormal movements: None, normal Patient's awareness of abnormal movements (rate only patient's report): No Awareness, Dental Status Current problems with teeth and/or dentures?: No Does patient usually wear dentures?: No  CIWA:  CIWA-Ar Total: 1 COWS:  COWS Total Score: 2   See Psychiatric Specialty Exam and Suicide Risk Assessment completed by Attending Physician prior to discharge.  Discharge destination:  Home  Is patient on multiple antipsychotic therapies at discharge:  No   Has Patient had three or more failed trials of antipsychotic monotherapy by history:  No    Recommended Plan for Multiple Antipsychotic Therapies: NA     Medication List    STOP taking these medications        ibuprofen 800 MG tablet   Commonly known as:  ADVIL,MOTRIN     levofloxacin 750 MG tablet  Commonly known as:  LEVAQUIN     risperiDONE 2 MG disintegrating tablet  Commonly known as:  RISPERDAL M-TABS      TAKE these medications      Indication   ARIPiprazole 15 MG tablet  Commonly known as:  ABILIFY  Take 0.5 tablets (7.5 mg total) by mouth at bedtime.   Indication:  mood stabilization     ferrous sulfate 325 (65 FE) MG tablet  Take 1 tablet (325 mg total) by mouth daily. For iron deficiency anemia   Indication:  Iron Deficiency     hydrochlorothiazide 25 MG tablet  Commonly known as:  HYDRODIURIL  Take 0.5 tablets (12.5 mg total) by mouth daily. For HTN   Indication:  High Blood Pressure     venlafaxine XR 75 MG 24 hr capsule  Commonly known as:  EFFEXOR-XR  Take 3 capsules (225 mg total) by mouth daily with breakfast.   Indication:  Major Depressive Disorder           Follow-up Information    Follow up with [facility] On 05/13/2015.   Why:   Monday at 4pm with Barbette Merinoarolyn McDonald, NP   Contact information:   [address]   [place] Phone:  [phone] Fax:        Follow up with outpatient PCP.   Why:  Need to follow up as an outpatient as you just finished a course of antibiotics for community acquired pneumonia      Follow-up recommendations:  Activity:  as tol Diet:  as tol  Comments:  1.  Take all your medications as prescribed.              2.  Report any adverse side effects to outpatient provider.                       3.  Patient instructed to not use alcohol or illegal drugs while on prescription medicines.            4.  In the event of worsening symptoms, instructed patient to call 911, the crisis hotline or go to nearest emergency room for evaluation of symptoms.  Total Discharge Time:  30 min  Signed: Velna Hatchet May Oley Lahaie AGNP-BC 05/07/2015, 11:30 AM

## 2015-05-07 NOTE — Progress Notes (Signed)
Patient ID: Grace Mack, female   DOB: 08/29/1965, 49 y.o.   MRN: [id number]  Pt currently presents with a blunted affect and cooperative but suspicious behavior. Per self inventory, pt rates depression, hopelessness and anxiety at a 0. Pt's daily goal is to "go home." Pt reports good sleep, a good appetite, normal energy and good concentration. Pt takes all scheduled medications.   Pt provided with medications per providers orders. Pt's labs and vitals were monitored throughout the day. Pt supported emotionally and encouraged to express concerns and questions. Pt educated on medications.  Pt's safety ensured with 15 minute and environmental checks. Pt currently denies SI/HI and A/V hallucinations. Pt verbally agrees to seek staff if SI/HI or A/VH occurs and to consult with staff before acting on these thoughts. Pt to be discharged today per MD. Will continue POC.

## 2015-05-07 NOTE — [facility] Suicide Risk Assessment (Signed)
[facility] Dis[facility] Assessment   Demographic Factors:  Divorced or widowed  Total Time spent with patient: 30 minutes  Musculoskeletal: Strength & Muscle Tone: within normal limits Gait & Station: normal Patient leans: N/A  Psychiatric Specialty Exam: Physical Exam  Review of Systems  Psychiatric/Behavioral: Negative for depression, suicidal ideas, hallucinations and substance abuse. The patient is not nervous/anxious.   All other systems reviewed and are negative.   Blood pressure 120/82, pulse 82, temperature 98 F (36.7 C), temperature source Oral, resp. rate 18, height 5\' 4"  (1.626 m), weight 69.854 kg (154 lb), last menstrual period 04/07/2015, SpO2 100 %.Body mass index is 26.42 kg/(m^2).  General Appearance: Casual  Eye Contact::  Fair  Speech:  Clear and Coherent409  Volume:  Normal  Mood:  Depressed IMPROVED  Affect:  Appropriate  Thought Process:  Coherent  Orientation:  Full (Time, Place, and Person)  Thought Content:  WDL  Suicidal Thoughts:  No  Homicidal Thoughts:  No  Memory:  Immediate;   Fair Recent;   Fair Remote;   Fair  Judgement:  Fair  Insight:  Fair  Psychomotor Activity:  Normal  Concentration:  Fair  Recall:  FiservFair  Fund of Knowledge:Fair  Language: Fair  Akathisia:  No  Handed:  Right  AIMS (if indicated):     Assets:  Communication Skills Desire for Improvement  Sleep:  Number of Hours: 6.25  Cognition: WNL  ADL's:  Intact   Have you used any form of tobacco in the last 30 days? (Cigarettes, Smokeless Tobacco, Cigars, and/or Pipes): No  Has this patient used any form of tobacco in the last 30 days? (Cigarettes, Smokeless Tobacco, Cigars, and/or Pipes) No  Mental Status Per Nursing Assessment::   On Admission:  NA  Current Mental Status by Physician: Pt continues to have some psychomotor retardation, but overall seen as improved , is less paranoid, seen as palying basketball yesterday.Denies SI/HI/AH/VH  Loss  Factors: NA  Historical Factors: Impulsivity  Risk Reduction Factors:   Positive social support  Continued Clinical Symptoms:  Previous Psychiatric Diagnoses and Treatments Medical Diagnoses and Treatments/Surgeries  Cognitive Features That Contribute To Risk:  None    Suicide Risk:  Minimal: No identifiable suicidal ideation.  Patients presenting with no risk factors but with morbid ruminations; may be classified as minimal risk based on the severity of the depressive symptoms  Principal Problem: MDD (major depressive disorder), recurrent, severe, with psychosis [facility](HCC) Discharge Diagnoses:  Patient Active Problem List   Diagnosis Date Noted  . 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 CAP (community acquired pneumonia) [J18.9] 04/28/2015  . MDD (major depressive disorder), recurrent, severe, with psychosis (HCC) [F33.3] 04/28/2015  . Uterus biforus [Q51.3] 01/12/2015  . Essential (primary) hypertension [I10] 12/24/2012  . Anemia, iron deficiency [D50.9] 06/23/2012  . Chronic hepatitis B with hepatic coma (HCC) [B18.1] 06/22/2012    Follow-up Information    Follow up with [facility] On 05/13/2015.   Why:   Monday at 4pm with Barbette Merinoarolyn McDonald, NP   Contact information:   [address]   [place] Phone:  [phone] Fax:        Plan Of Care/Follow-up recommendations:  Activity:  No restrictions Diet:  regular Tests:  Will need to follow up with PCP for CAP. Other:  None  Is patient on multiple antipsychotic therapies at discharge:  No   Has Patient had three or more failed trials of antipsychotic monotherapy by history:  No  Recommended Plan for Multiple Antipsychotic Therapies: NA    Tevyn Codd MD 05/07/2015, 9:41 AM

## 2015-05-07 NOTE — Progress Notes (Signed)
Patient ID: Grace Mack, female   DOB: 01/22/1966, 49 y.o.   MRN: [id number] Adult Psychoeducational Group Note  Date:  05/07/2015 Time: 09:35am  Group Topic/Focus:  Recovery Goals:   The focus of this group is to identify appropriate goals for recovery and establish a plan to achieve them.  Participation Level:  Active  Participation Quality:  Appropriate  Affect:  Appropriate  Cognitive:  Alert and Oriented  Insight: Improving  Engagement in Group:  Engaged  Modes of Intervention:  Activity, Education and Support  Additional Comments:  Pt participated in group, able to identify one daily goal to accomplish today.  Grace Mack, Grace Mack 05/07/2015, 10:53 AM

## 2015-05-14 ENCOUNTER — Encounter (HOSPITAL_COMMUNITY): Payer: Self-pay | Admitting: Emergency Medicine

## 2015-05-14 ENCOUNTER — Emergency Department (HOSPITAL_COMMUNITY)
Admission: EM | Admit: 2015-05-14 | Discharge: 2015-05-15 | Disposition: A | Discharge status: Other Acute Inpatient Hospital | Payer: BC Managed Care – PPO | Attending: Emergency Medicine | Admitting: Emergency Medicine

## 2015-05-14 DIAGNOSIS — Z79899 Other long term (current) drug therapy: Secondary | ICD-10-CM | POA: Diagnosis not present

## 2015-05-14 DIAGNOSIS — F329 Major depressive disorder, single episode, unspecified: Secondary | ICD-10-CM | POA: Insufficient documentation

## 2015-05-14 DIAGNOSIS — I10 Essential (primary) hypertension: Secondary | ICD-10-CM | POA: Diagnosis not present

## 2015-05-14 DIAGNOSIS — F333 Major depressive disorder, recurrent, severe with psychotic symptoms: Secondary | ICD-10-CM | POA: Diagnosis present

## 2015-05-14 DIAGNOSIS — F419 Anxiety disorder, unspecified: Secondary | ICD-10-CM | POA: Diagnosis not present

## 2015-05-14 DIAGNOSIS — Z046 Encounter for general psychiatric examination, requested by authority: Secondary | ICD-10-CM | POA: Diagnosis present

## 2015-05-14 DIAGNOSIS — Z3202 Encounter for pregnancy test, result negative: Secondary | ICD-10-CM | POA: Diagnosis not present

## 2015-05-14 DIAGNOSIS — F29 Unspecified psychosis not due to a substance or known physiological condition: Secondary | ICD-10-CM | POA: Insufficient documentation

## 2015-05-14 LAB — COMPREHENSIVE METABOLIC PANEL
ALT: 22 U/L (ref 14–54)
AST: 34 U/L (ref 15–41)
Albumin: 3.8 g/dL (ref 3.5–5.0)
Alkaline Phosphatase: 72 U/L (ref 38–126)
Anion gap: 8 (ref 5–15)
BUN: 6 mg/dL (ref 6–20)
CO2: 25 mmol/L (ref 22–32)
Calcium: 9 mg/dL (ref 8.9–10.3)
Chloride: 106 mmol/L (ref 101–111)
Creatinine, Ser: 0.61 mg/dL (ref 0.44–1.00)
GFR calc Af Amer: >60 mL/min (ref >60)
GFR calc non Af Amer: >60 mL/min (ref >60)
Glucose, Bld: 76 mg/dL (ref 65–99)
Potassium: 3.7 mmol/L (ref 3.5–5.1)
Sodium: 139 mmol/L (ref 135–145)
Total Bilirubin: 0.9 mg/dL (ref 0.3–1.2)
Total Protein: 7.6 g/dL (ref 6.5–8.1)

## 2015-05-14 LAB — CBC
HCT: 32.1 % — ABNORMAL LOW (ref 36.0–46.0)
Hemoglobin: 10.9 g/dL — ABNORMAL LOW (ref 12.0–15.0)
MCH: 26.3 pg (ref 26.0–34.0)
MCHC: 34 g/dL (ref 30.0–36.0)
MCV: 77.5 fL — ABNORMAL LOW (ref 78.0–100.0)
Platelets: 200 10*3/uL (ref 150–400)
RBC: 4.14 MIL/uL (ref 3.87–5.11)
RDW: 16.4 % — ABNORMAL HIGH (ref 11.5–15.5)
WBC: 6.8 10*3/uL (ref 4.0–10.5)

## 2015-05-14 LAB — RAPID URINE DRUG SCREEN, HOSP PERFORMED
Amphetamines: NOT DETECTED
Barbiturates: NOT DETECTED
Benzodiazepines: NOT DETECTED
Cocaine: NOT DETECTED
Opiates: NOT DETECTED
Tetrahydrocannabinol: NOT DETECTED

## 2015-05-14 LAB — SALICYLATE LEVEL: Salicylate Lvl: <4 mg/dL (ref 2.8–30.0)

## 2015-05-14 LAB — I-STAT BETA HCG BLOOD, ED (MC, WL, AP ONLY): I-stat hCG, quantitative: <5 m[IU]/mL (ref <5)

## 2015-05-14 LAB — ETHANOL: Alcohol, Ethyl (B): <5 mg/dL (ref <5)

## 2015-05-14 LAB — ACETAMINOPHEN LEVEL: Acetaminophen (Tylenol), Serum: <10 ug/mL — ABNORMAL LOW (ref 10–30)

## 2015-05-14 MED ORDER — HYDROCHLOROTHIAZIDE 12.5 MG PO CAPS
12.5000 mg | ORAL_CAPSULE | Freq: Every day | ORAL | Status: DC
  Administered 2015-05-14 – 2015-05-15 (×2): 12.5 mg via ORAL
  Filled 2015-05-14 (×2): qty 1

## 2015-05-14 MED ORDER — ARIPIPRAZOLE 15 MG PO TABS
7.5000 mg | ORAL_TABLET | Freq: Every day | ORAL | Status: DC
  Filled 2015-05-14 (×3): qty 1

## 2015-05-14 MED ORDER — HYDROCHLOROTHIAZIDE 25 MG PO TABS
12.5000 mg | ORAL_TABLET | Freq: Every day | ORAL | Status: DC
  Filled 2015-05-14: qty 0.5

## 2015-05-14 MED ORDER — ACETAMINOPHEN 500 MG PO TABS
1000.0000 mg | ORAL_TABLET | Freq: Four times a day (QID) | ORAL | Status: DC | PRN
  Administered 2015-05-14: 1000 mg via ORAL
  Filled 2015-05-14: qty 2

## 2015-05-14 MED ORDER — FERROUS SULFATE 325 (65 FE) MG PO TABS
325.0000 mg | ORAL_TABLET | ORAL | Status: DC
  Administered 2015-05-14: 325 mg via ORAL
  Filled 2015-05-14 (×2): qty 1

## 2015-05-14 MED ORDER — VENLAFAXINE HCL ER 75 MG PO CP24
225.0000 mg | ORAL_CAPSULE | Freq: Every day | ORAL | Status: DC
  Administered 2015-05-15: 225 mg via ORAL
  Filled 2015-05-14 (×2): qty 1

## 2015-05-14 NOTE — ED Notes (Addendum)
[facility]. Papers state patient is "a danger to herself, to wit: believes someone is touching her in some way when no one is there. Keeps saying 'Get off me;' Ran [place] traffic on [address] this morning." Pt denies SI/HI at this time.

## 2015-05-14 NOTE — ED Notes (Signed)
Patient refused her HS medication after it was opened. She appeared restless and continues to be somatic. He continues to request to be transferred to main ED for Xray and head scan. Patient also requested for menstrual pads and mesh pant. Q 15 minute check continues for safety.

## 2015-05-14 NOTE — ED Notes (Signed)
Patient appeared paranoid. She keep requesting for staff to send her to main ED because she wants the physician to check her head. She reported that she felt something was wrong with her head and she want the physician to look through her ear and check what is inside her head. She denied SI/HI and denied Hallucinations. Although she appeared preoccupied. Writer encouraged and supported patient. Q 15 minute checks continue as ordered for safety.

## 2015-05-14 NOTE — Progress Notes (Signed)
Pt transferred from TCU to [facility] earlier this shift. Per report pt was IVC'd by mom for being a danger to herself. Pt ran into traffic, paranoid--feels like someone is touching her "Get off me". Pt denies SI, HI, AVH and pain. Pt denied being dangerous to self and states "My mom and I don't get along and so she put me in here". Pt was cooperative with initial nursing assessment. Pt is guarded with flat affect and depressed mood. New order received for PRN Tylenol 1,000 mg PO for c/o pain (headache) and was administered at 1853. Q 15 minutes checks maintained for safety as ordered without self injurious behavior or outburst to note at this time.

## 2015-05-14 NOTE — BH Assessment (Signed)
BHH Assessment Progress Note   The following facilities have been contacted to seek placement for this pt, with results as noted:  Beds available, information sent, decision pending:  [place] [facility]   No beds available, but accepting referrals for future consideration; information sent:  [facility]   At capacity:  [place] [facility]   Sumaiyah Markert, [place] Triage Specialist [phone]

## 2015-05-14 NOTE — ED Provider Notes (Signed)
CSN: [id number]     Arrival date & time 05/14/15  1037 History   First MD Initiated Contact with Patient 05/14/15 1055     Chief Complaint  Patient presents with  . IVC      (Consider location/radiation/quality/duration/timing/severity/associated sxs/prior Treatment) HPI   49 year old female brought in with IVC paperwork. Reportedly danger to herself. Reportedly behaving as if someone is touching even when no one is around and saying "get off of me." Apparently ran into traffic earlier this morning. Patient denies these claims. She reports a history of depression. She is on Abilify and Effexor. She reports compliance. Denies any thoughts of wanting to harm herself or anyone else. Denies any drug use. She reports ongoing relationship issues with her mother and that she feels like she is treated like a child. Cannot specifically identify any acute stressors though.  Past Medical History  Diagnosis Date  . Hypertension   . Anxiety    Past Surgical History  Procedure Laterality Date  . Cervical biopsy     Family History  Problem Relation Age of Onset  . CAD Father    Social History  Substance Use Topics  . Smoking status: Never Smoker   . Smokeless tobacco: Never Used  . Alcohol Use: No   OB History    No data available     Review of Systems  All systems reviewed and negative, other than as noted in HPI.   Allergies  Review of patient's allergies indicates no known allergies.  Home Medications   Prior to Admission medications   Medication Sig Start Date End Date Taking? Authorizing Provider  ARIPiprazole (ABILIFY) 15 MG tablet Take 0.5 tablets (7.5 mg total) by mouth at bedtime. 05/07/15   Adonis Brook, NP  ferrous sulfate 325 (65 FE) MG tablet Take 1 tablet (325 mg total) by mouth daily. For iron deficiency anemia 05/07/15   Adonis Brook, NP  hydrochlorothiazide (HYDRODIURIL) 25 MG tablet Take 0.5 tablets (12.5 mg total) by mouth daily. For HTN 05/07/15   Adonis Brook, NP  venlafaxine XR (EFFEXOR-XR) 75 MG 24 hr capsule Take 3 capsules (225 mg total) by mouth daily with breakfast. 05/07/15   Adonis Brook, NP   BP 139/93 mmHg  Pulse 91  Temp(Src) 98.3 F (36.8 C) (Oral)  Resp 16  SpO2 99%  LMP 04/30/2015 Physical Exam  Constitutional: She appears well-developed and well-nourished. No distress.  HENT:  Head: Normocephalic and atraumatic.  Eyes: Conjunctivae are normal. Right eye exhibits no discharge. Left eye exhibits no discharge.  Neck: Neck supple.  Cardiovascular: Normal rate, regular rhythm and normal heart sounds.  Exam reveals no gallop and no friction rub.   No murmur heard. Pulmonary/Chest: Effort normal and breath sounds normal. No respiratory distress.  Abdominal: Soft. She exhibits no distension. There is no tenderness.  Musculoskeletal: She exhibits no edema or tenderness.  Neurological: She is alert.  Skin: Skin is warm and dry.  Psychiatric:  Speech is clear. Pain patient answers questions appropriately although there is often a long pause prior to actually speaking. Prolonged eye contact without speaking and often also staring off into space.  Nursing note and vitals reviewed.   ED Course  Procedures (including critical care time) Labs Review Labs Reviewed  ACETAMINOPHEN LEVEL - Abnormal; Notable for the following:    Acetaminophen (Tylenol), Serum <10 (*)    All other components within normal limits  CBC - Abnormal; Notable for the following:    Hemoglobin 10.9 (*)  HCT 32.1 (*)    MCV 77.5 (*)    RDW 16.4 (*)    All other components within normal limits  COMPREHENSIVE METABOLIC PANEL  ETHANOL  SALICYLATE LEVEL  URINE RAPID DRUG SCREEN, HOSP PERFORMED  I-STAT BETA HCG BLOOD, ED ([facility], [facility], [facility] ONLY)    Imaging Review No results found. I have personally reviewed and evaluated these images and lab results as part of my medical decision-making.   EKG Interpretation None      MDM   Final diagnoses:   Psychosis, unspecified psychosis type    49 year old female brought in with involuntary commitment paperwork. Reportedly patient with hallucinations and running into traffic this morning. On my exam patient is cooperative. She has an odd affect though and staring off for long periods. Suspect she may be responding to internal stimuli. We'll medically clear for further psychiatric evaluation.  12:45 PM Minimal anemia. Noncontributory. Medically cleared.   Raeford RazorStephen Metztli Sachdev, MD 05/14/15 [phone]

## 2015-05-14 NOTE — BH Assessment (Signed)
Per Aggie Cosierheresa, [facility] tomorrow morning after 9am. Patient accepted by Dr. Otho PerlaJ Thotakura. [facility] upon arrival to the facility. Nursing report [phone]

## 2015-05-14 NOTE — Progress Notes (Signed)
Patient listed as having [employer] insurance without a pcp.  EDCM went to speak to patient at bedside, however, patient was not in the room.  [facility] with list of pcps who accept [employer] insurance within a ten mile radius of patient's zip code [phone].  This information was left on table in patient's room.  No further EDCM needs at this time.

## 2015-05-14 NOTE — BH Assessment (Signed)
Per Theresa, patient accepted to [facility] for admission tomorrow morning after 9am. Patient accepted by Dr. RaJ Thotakura. Patient to present to the [facility] upon arrival to the facility. Nursing report #[phone]  

## 2015-05-14 NOTE — ED Notes (Signed)
Pt eating lunch.  Calm, cooperative and no distress noted.

## 2015-05-14 NOTE — BH Assessment (Signed)
Assessment Note  Grace Mack is an 49 y.o. female brought in with IVC paperwork. Reportedly danger to herself. Reportedly behaving as if someone is touching even when no one is around and saying "get off of me." Patient reportedly ran into traffic earlier this morning. Patient denies these allegations. She reports a history of depression. She is on Abilify and Effexor. She reports compliance. She follows up with [facility] . Her last appointment was scheduled 05/13/2015 with Barbette Merino, NP but it unclear if she went to this appointment. Denies any thoughts of wanting to harm herself or anyone else. Denies any drug use. She reports ongoing relationship issues with her mother and that she feels like she is treated like a child. Cannot specifically identify any acute stressors though she is recently divorced and cares for her 65 yr old son.  Patient denies SI, HI, and AVH's. Although patient denies AVH's patient is easily distracted and had to be redirected. Patient later explains that she does not get along with her mother. She sts, "My mother is hosting ghost parties". Sts she has witnessed ghost eating her mothers insides. Patient feels that now the ghost are trying to eat her like they are her mother.   Writer discussed the above TTS assessment with May, NP and inpatient treatment is recommended.   Diagnosis: 295.70 Schizoaffective disorder, Depressive type  Past Medical History:  Past Medical History  Diagnosis Date  . Hypertension   . Anxiety     Past Surgical History  Procedure Laterality Date  . Cervical biopsy      Family History:  Family History  Problem Relation Age of Onset  . CAD Father     Social History:  reports that she has never smoked. She has never used smokeless tobacco. She reports that she does not drink alcohol or use illicit drugs.  Additional Social History:  Alcohol / Drug Use Pain Medications: SEE MAR Prescriptions: SEE MAR Over  the Counter: SEE MAR History of alcohol / drug use?: No history of alcohol / drug abuse  CIWA: CIWA-Ar BP: 139/93 mmHg Pulse Rate: 91 COWS:    Allergies: No Known Allergies  Home Medications:  (Not in a hospital admission)  OB/GYN Status:  Patient's last menstrual period was 04/30/2015.  General Assessment Data Location of Assessment: [facility] TTS Assessment: In system Is this a Tele or Face-to-Face Assessment?: Face-to-Face Is this an Initial Assessment or a Re-assessment for this encounter?: Initial Assessment Marital status: Divorced Kelly name:  Laural Benes ) Is patient pregnant?: No Pregnancy Status: No Living Arrangements: Parent Can pt return to current living arrangement?: Yes Admission Status: Involuntary Is patient capable of signing voluntary admission?: Yes Referral Source: Self/Family/Friend Insurance type:  Herbalist)     Crisis Care Plan Living Arrangements: Parent Name of Psychiatrist: [facility] [place] Name of Therapist: None  Education Status Is patient currently in school?: No Current Grade:  (n/a) Highest grade of school patient has completed: Engineer, maintenance (IT) (Accounting Major) Name of school:  [employer] Contact person: n/a  Risk to self with the past 6 months Suicidal Ideation: No Has patient been a risk to self within the past 6 months prior to admission? : No Suicidal Intent: No Has patient had any suicidal intent within the past 6 months prior to admission? : No Is patient at risk for suicide?: No Suicidal Plan?: No Has patient had any suicidal plan within the past 6 months prior to admission? : No Access to Means: No What has  been your use of drugs/alcohol within the last 12 months?:  (n/a) Previous Attempts/Gestures: No How many times?:  (n/a) Other Self Harm Risks:  (n/a) Triggers for Past Attempts: None known Intentional Self Injurious Behavior: None Family Suicide History: Unknown Recent stressful life event(s): Other (Comment) Persecutory  voices/beliefs?: No Depression: Yes Depression Symptoms: Feeling angry/irritable, Feeling worthless/self pity, Loss of interest in usual pleasures, Guilt, Fatigue, Isolating, Tearfulness, Insomnia, Despondent Substance abuse history and/or treatment for substance abuse?: No Suicide prevention information given to non-admitted patients: Not applicable  Risk to Others within the past 6 months Homicidal Ideation: No Does patient have any lifetime risk of violence toward others beyond the six months prior to admission? : No Thoughts of Harm to Others: No Current Homicidal Intent: No Current Homicidal Plan: No Access to Homicidal Means: No Identified Victim:  (n/a) History of harm to others?: No Assessment of Violence: None Noted Violent Behavior Description:  (patient is calm and cooperative ) Does patient have access to weapons?: No Criminal Charges Pending?: No Does patient have a court date: No Is patient on probation?: No  Psychosis Hallucinations: None noted Delusions: None noted  Mental Status Report Appearance/Hygiene: Disheveled Eye Contact: Good Motor Activity: Freedom of movement Speech: Soft, Slow Level of Consciousness: Quiet/awake Mood: Depressed Affect: Blunted Anxiety Level: None Thought Processes: Coherent, Relevant Judgement: Impaired Orientation: Person, Place, Time, Situation Obsessive Compulsive Thoughts/Behaviors: None  Cognitive Functioning Concentration: Decreased Memory: Recent Intact, Remote Intact IQ: Average Insight: Fair Impulse Control: Fair Appetite: Good Weight Loss:  (none reported) Weight Gain:  (none reported) Sleep: Decreased Vegetative Symptoms: None  ADLScreening [facility] Assessment Services) Patient's cognitive ability adequate to safely complete daily activities?: Yes Patient able to express need for assistance with ADLs?: Yes Independently performs ADLs?: Yes (appropriate for developmental age)  Prior Inpatient Therapy Prior  Inpatient Therapy: Yes Prior Therapy Dates: Multiple (Discharged from [facility] 1.5 week ago) Prior Therapy Facilty/Provider(s): [facility], [facility] Reason for Treatment: Psychosis  Prior Outpatient Therapy Prior Outpatient Therapy: Yes Prior Therapy Dates: Current Prior Therapy Facilty/Provider(s): Pt says she sees a Veterinary surgeon Reason for Treatment:  ([facility]) Does patient have an ACCT team?: No Does patient have Intensive In-House Services?  : No Does patient have [facility] services? : No Does patient have P4CC services?: No  ADL Screening (condition at time of admission) Patient's cognitive ability adequate to safely complete daily activities?: Yes Is the patient deaf or have difficulty hearing?: No Does the patient have difficulty seeing, even when wearing glasses/contacts?: No Does the patient have difficulty concentrating, remembering, or making decisions?: No Patient able to express need for assistance with ADLs?: Yes Does the patient have difficulty dressing or bathing?: No Independently performs ADLs?: Yes (appropriate for developmental age) Does the patient have difficulty walking or climbing stairs?: No Weakness of Legs: None Weakness of Arms/Hands: None  Home Assistive Devices/Equipment Home Assistive Devices/Equipment: None    Abuse/Neglect Assessment (Assessment to be complete while patient is alone) Physical Abuse: Denies Verbal Abuse: Denies Sexual Abuse: Denies Exploitation of patient/patient's resources: Denies Self-Neglect: Denies Values / Beliefs Cultural Requests During Hospitalization: None Spiritual Requests During Hospitalization: None   Advance Directives (For Healthcare) Does patient have an advance directive?: No Would patient like information on creating an advanced directive?: No - patient declined information    Additional Information 1:1 In Past 12 Months?: No CIRT Risk: No Elopement Risk: No Does patient have medical  clearance?: Yes     Disposition:  Disposition Initial Assessment Completed for this Encounter:  Yes Disposition of Patient: Inpatient treatment program Type of inpatient treatment program: Adult (Patient meets criteria for inpatient treatment per May, NP)  On Site Evaluation by:   Reviewed with Physician:    Melynda Rippleerry, Shaunda Tipping [facility] 05/14/2015 1:06 PM

## 2015-05-14 NOTE — ED Notes (Signed)
Pt's 1 (labeled w/pt label) belongings bag has been switched from locker #27 to locker #41.

## 2015-05-15 DIAGNOSIS — F333 Major depressive disorder, recurrent, severe with psychotic symptoms: Secondary | ICD-10-CM

## 2015-05-15 NOTE — ED Notes (Signed)
Patient refused HS medication. She has been pacing the hallway and going to the bathroom in between and requesting for pads.Patient is preoccupied and seems to be responding to internal stimuli. She is paranoid and somatic.

## 2015-05-15 NOTE — ED Notes (Signed)
Patient continues pacing hallway. Writer encouraged patient to go and rest in her room and even offered to stay with patient for few minutes in the room. Patient refused.

## 2015-05-15 NOTE — ED Notes (Signed)
Patient came to the medication window and insisted that her b/p taken. She continues pacing the hallway and refused to rest in her room.

## 2015-05-15 NOTE — ED Notes (Signed)
Patient sat on the hallway. She seems to be very paranoid and scared about going inside her bedroom. Writer asked if patient had visual hallucinations, she denied . Although she seemed to be responding to internal stimuli.

## 2015-05-16 NOTE — [facility] Suicide Risk Assessment (Cosign Needed)
Suicide Risk Assessment  Discharge Assessment   [facility]   Demographic Factors:  Divorced or widowed, Low socioeconomic status, Living alone and Unemployed  Total Time spent with patient: 30 minutes  Musculoskeletal: Strength & Muscle Tone: within normal limits Gait & Station: normal Patient leans: N/A  Psychiatric Specialty Exam:     Blood pressure 151/89, pulse 87, temperature 97.5 F (36.4 C), temperature source Oral, resp. rate 18, last menstrual period 04/30/2015, SpO2 100 %.There is no weight on file to calculate BMI.  General Appearance: Fairly Groomed  Patent attorneyye Contact::  Fair  Speech:  [phone]  Volume:  Normal  Mood:  Euthymic  Affect:  Appropriate  Thought Process:  Coherent  Orientation:  Full (Time, Place, and Person)  Thought Content:  Rumination  Suicidal Thoughts:  No  Homicidal Thoughts:  No  Memory:  Immediate;   Fair Recent;   Fair Remote;   Fair  Judgement:  Impaired  Insight:  Lacking  Psychomotor Activity:  Normal  Concentration:  Poor  Recall:  Poor  Fund of Knowledge:Poor  Language: Good  Akathisia:  Negative  Handed:  Right  AIMS (if indicated):     Assets:  Desire for Improvement Financial Resources/Insurance Resilience Talents/Skills  Sleep:     Cognition: WNL  ADL's:  Intact      Has this patient used any form of tobacco in the last 30 days? (Cigarettes, Smokeless Tobacco, Cigars, and/or Pipes) No  Mental Status Per Nursing Assessment::   On Admission:     Current Mental Status by Physician: NA  Loss Factors: NA  Historical Factors: Impulsivity  Risk Reduction Factors:   Positive social support  Continued Clinical Symptoms:  Depression:   Hopelessness Schizophrenia:   Depressive state  Cognitive Features That Contribute To Risk:  None    Suicide Risk:  Moderate:  Frequent suicidal ideation with limited intensity, and duration, some specificity in terms of plans, no associated intent,  good self-control, limited dysphoria/symptomatology, some risk factors present, and identifiable protective factors, including available and accessible social support.  Principal Problem: MDD (major depressive disorder), recurrent, severe, with psychosis [facility](HCC) Discharge Diagnoses:  Patient Active Problem List   Diagnosis Date Noted  . MDD (major depressive disorder), recurrent, severe, with psychosis (HCC) [F33.3] 04/28/2015    Priority: High  . 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 CAP (community acquired pneumonia) [J18.9] 04/28/2015  . Uterus biforus [Q51.3] 01/12/2015  . Essential (primary) hypertension [I10] 12/24/2012  . Anemia, iron deficiency [D50.9] 06/23/2012  . Chronic hepatitis B with hepatic coma (HCC) [B18.1] 06/22/2012      Plan Of Care/Follow-up recommendations:  Activity:  sd tol Diet:  as tol  Is patient on multiple antipsychotic therapies at discharge:  No   Has Patient had three or more failed trials of antipsychotic monotherapy by history:  No  Recommended Plan for Multiple Antipsychotic Therapies: NA  Velna HatchetSheila May Kaylei Frink AGNP-BC 05/16/2015, 2:57 PM

## 2015-05-16 NOTE — Consult Note (Signed)
[facility]   Reason for Consult:  depression Referring Physician:  WLEDP Patient Identification: Grace Mack MRN:  [id number] Principal Diagnosis: MDD (major depressive disorder), recurrent, severe, with psychosis (HCC) Diagnosis:   Patient Active Problem List   Diagnosis Date Noted  . MDD (major depressive disorder), recurrent, severe, with psychosis (HCC) [F33.3] 04/28/2015    Priority: High  . 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 CAP (community acquired pneumonia) [J18.9] 04/28/2015  . Uterus biforus [Q51.3] 01/12/2015  . Essential (primary) hypertension [I10] 12/24/2012  . Anemia, iron deficiency [D50.9] 06/23/2012  . 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 female patient admitted with psychosis.  HPI:  Grace Mack is a 49 y.o. female patient admitted with psychosis  HPI: Per ED provider, Grace Relicammy is a 49 year old female brought in with IVC paperwork. Reportedly danger to herself. Reportedly behaving as if someone is touching even when no one is around and saying "get off of me." Apparently ran into traffic earlier this morning. Patient denies these claims. She reports a history of depression. She is on Abilify and Effexor. She reports compliance. Denies any thoughts of wanting to harm herself or anyone else. Denies any drug use. She reports ongoing relationship issues with her mother and that she feels like she is treated like a child. Cannot specifically identify any acute stressors though.    Past Psychiatric History:  MDD  Risk to Self: Suicidal Ideation: No Suicidal Intent: No Is patient at risk for suicide?: No Suicidal Plan?: No Access to Means: No What has been your use of drugs/alcohol within the last 12 months?:  (n/a) How many times?:  (n/a) Other Self Harm Risks:  (n/a) Triggers for Past Attempts: None  known Intentional Self Injurious Behavior: None Risk to Others: Homicidal Ideation: No Thoughts of Harm to Others: No Current Homicidal Intent: No Current Homicidal Plan: No Access to Homicidal Means: No Identified Victim:  (n/a) History of harm to others?: No Assessment of Violence: None Noted Violent Behavior Description:  (patient is calm and cooperative ) Does patient have access to weapons?: No Criminal Charges Pending?: No Does patient have a court date: No Prior Inpatient Therapy: Prior Inpatient Therapy: Yes [facility]: [facility] Reason for Treatment: Psychosis Prior Outpatient Therapy: Prior Outpatient Therapy: Yes Prior Therapy Dates: Current Prior Therapy Facilty/Provider(s): Pt says she sees a Veterinary surgeoncounselor Reason for Treatment:  ([facility]) Does patient have an ACCT team?: No Does patient have Intensive In-House Services?  : No Does patient have [facility] services? : No Does patient have P4CC services?: No  Past Medical History:  Past Medical History  Diagnosis Date  . Hypertension   . Anxiety     Past Surgical History  Procedure Laterality Date  . Cervical biopsy     Family History:  Family History  Problem Relation Age of Onset  . CAD Father    Family Psychiatric  History: Denies Social History:  History  Alcohol Use No     History  Drug Use No    Social History   Social History  . Marital Status: Married    Spouse Name: N/A  . Number of Children: N/A  . Years of Education: N/A   Social History Main Topics  . Smoking status: Never Smoker   . Smokeless  tobacco: Never Used  . Alcohol Use: No  . Drug Use: No  . Sexual Activity: No   Other Topics Concern  . None   Social History Narrative   Additional Social History:    Pain Medications: SEE MAR Prescriptions: SEE MAR Over the Counter: SEE MAR History of alcohol / drug use?:  No history of alcohol / drug abuse       Allergies:  No Known Allergies  Labs: No results found for this or any previous visit (from the past 48 hour(s)).  No current facility-administered medications for this encounter.   Current Outpatient Prescriptions  Medication Sig Dispense Refill  . ARIPiprazole (ABILIFY) 15 MG tablet Take 0.5 tablets (7.5 mg total) by mouth at bedtime. 30 tablet 0  . docusate sodium (COLACE) 100 MG capsule Take 100 mg by mouth 2 (two) times daily as needed for mild constipation.    . ferrous sulfate 325 (65 FE) MG tablet Take 1 tablet (325 mg total) by mouth daily. For iron deficiency anemia  3  . hydrochlorothiazide (HYDRODIURIL) 25 MG tablet Take 0.5 tablets (12.5 mg total) by mouth daily. For HTN    . venlafaxine XR (EFFEXOR-XR) 75 MG 24 hr capsule Take 3 capsules (225 mg total) by mouth daily with breakfast. 90 capsule 0    Musculoskeletal: Strength & Muscle Tone: within normal limits Gait & Station: normal Patient leans: N/A  Psychiatric Specialty Exam:  See SRA Review of Systems  All other systems reviewed and are negative.   Blood pressure 151/89, pulse 87, temperature 97.5 F (36.4 C), temperature source Oral, resp. rate 18, last menstrual period 04/30/2015, SpO2 100 %.There is no weight on file to calculate BMI.  Treatment Plan Summary: 1.  Take all your medications as prescribed.              2.  Report any adverse side effects to outpatient provider.                       3.  Patient instructed to not use alcohol or illegal drugs while on prescription medicines.            4.  In the event of worsening symptoms, instructed patient to call 911, the crisis hotline or go to nearest emergency room for evaluation of symptoms.  Disposition:  Accepted at [facility] May Agustin AGNP-BC 05/16/2015 2:52 PM  Patient seen face-to-face for psychiatric evaluation, chart reviewed and case discussed with the physician extender and developed  treatment plan. Reviewed the information documented and agree with the treatment plan. Thedore Mins, MD

## 2015-05-16 NOTE — Consult Note (Signed)
[facility] Face-to-Face Psychiatry Consult   Reason for Consult:  Psychosis, paranoia Referring Physician:  WLED Patient Identification: Grace Mack MRN:  [id number] Principal Diagnosis: MDD (major depressive disorder), recurrent, severe, with psychosis (HCC) Diagnosis:   Patient Active Problem List   Diagnosis Date Noted  . MDD (major depressive disorder), recurrent, severe, with psychosis (HCC) [F33.3] 04/28/2015    Priority: High  . 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 CAP (community acquired pneumonia) [J18.9] 04/28/2015  . Uterus biforus [Q51.3] 01/12/2015  . Essential (primary) hypertension [I10] 12/24/2012  . Anemia, iron deficiency [D50.9] 06/23/2012  . 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 female patient admitted with psychosis  HPI:  Per ED provider, [name] is a 49 year old female brought in with IVC paperwork. Reportedly danger to herself. Reportedly behaving as if someone is touching even when no one is around and saying "get off of me." Apparently ran into traffic earlier this morning. Patient denies these claims. She reports a history of depression. She is on Abilify and Effexor. She reports compliance. Denies any thoughts of wanting to harm herself or anyone else. Denies any drug use. She reports ongoing relationship issues with her mother and that she feels like she is treated like a child. Cannot specifically identify any acute stressors though.  Past Psychiatric History:   MDD with psychosis  Risk to Self: Suicidal Ideation: No Suicidal Intent: No Is patient at risk for suicide?: No Suicidal Plan?: No Access to Means: No What has been your use of drugs/alcohol within the last 12 months?:  (n/a) How many times?:  (n/a) Other Self Harm Risks:  (n/a) Triggers for Past Attempts: None known Intentional Self Injurious Behavior: None Risk to Others:  Homicidal Ideation: No Thoughts of Harm to Others: No Current Homicidal Intent: No Current Homicidal Plan: No Access to Homicidal Means: No Identified Victim:  (n/a) History of harm to others?: No Assessment of Violence: None Noted Violent Behavior Description:  (patient is calm and cooperative ) Does patient have access to weapons?: No Criminal Charges Pending?: No Does patient have a court date: No Prior Inpatient Therapy: Prior Inpatient Therapy: Yes Prior Therapy Dates: Multiple (Discharged from [facility] 1.5 week ago) Prior Therapy Facilty/Provider(s): [facility] Reason for Treatment: Psychosis Prior Outpatient Therapy: Prior Outpatient Therapy: Yes Prior Therapy Dates: Current Prior Therapy Facilty/Provider(s): Pt says she sees a Veterinary surgeon Reason for Treatment:  (Regional Psychiatry) Does patient have an ACCT team?: No Does patient have Intensive In-House Services?  : No Does patient have [facility] services? : No Does patient have P4CC services?: No  Past Medical History:  Past Medical History  Diagnosis Date  . Hypertension   . Anxiety     Past Surgical History  Procedure Laterality Date  . Cervical biopsy     Family History:  Family History  Problem Relation Age of Onset  . CAD Father    Family Psychiatric  History: Denies Social History:  History  Alcohol Use No     History  Drug Use No    Social History   Social History  . Marital Status: Married    Spouse Name: N/A  . Number of Children: N/A  . Years of Education: N/A   Social History Main Topics  . Smoking status: Never Smoker   . Smokeless tobacco: Never Used  . Alcohol Use: No  . Drug Use:  No  . Sexual Activity: No   Other Topics Concern  . None   Social History Narrative   Additional Social History:    Pain Medications: SEE MAR Prescriptions: SEE MAR Over the Counter: SEE MAR History of alcohol / drug use?: No history of alcohol / drug abuse       Allergies:  No Known  Allergies  Labs: No results found for this or any previous visit (from the past 48 hour(s)).  No current facility-administered medications for this encounter.   Current Outpatient Prescriptions  Medication Sig Dispense Refill  . ARIPiprazole (ABILIFY) 15 MG tablet Take 0.5 tablets (7.5 mg total) by mouth at bedtime. 30 tablet 0  . docusate sodium (COLACE) 100 MG capsule Take 100 mg by mouth 2 (two) times daily as needed for mild constipation.    . ferrous sulfate 325 (65 FE) MG tablet Take 1 tablet (325 mg total) by mouth daily. For iron deficiency anemia  3  . hydrochlorothiazide (HYDRODIURIL) 25 MG tablet Take 0.5 tablets (12.5 mg total) by mouth daily. For HTN    . venlafaxine XR (EFFEXOR-XR) 75 MG 24 hr capsule Take 3 capsules (225 mg total) by mouth daily with breakfast. 90 capsule 0    Musculoskeletal: Strength & Muscle Tone: within normal limits Gait & Station: normal Patient leans: N/A  Psychiatric Specialty Exam: Review of Systems  All other systems reviewed and are negative.   Blood pressure 151/89, pulse 87, temperature 97.5 F (36.4 C), temperature source Oral, resp. rate 18, last menstrual period 04/30/2015, SpO2 100 %.There is no weight on file to calculate BMI.  General Appearance: Disheveled  Eye SolicitorContact::  Fair  Speech:  Blocked  Volume:  Normal  Mood:  Anxious  Affect:  Non-Congruent and Constricted  Thought Process:  Disorganized  Orientation:  Full (Time, Place, and Person)  Thought Content:  Rumination  Suicidal Thoughts:  No  Homicidal Thoughts:  No  Memory:  Immediate;   Fair Recent;   Fair Remote;   Fair  Judgement:  Good  Insight:  Good  Psychomotor Activity:  Normal  Concentration:  Poor  Recall:  Poor  Fund of Knowledge:Poor  Language: Good  Akathisia:  No  Handed:  Right  AIMS (if indicated):     Assets:  Desire for Improvement Resilience  ADL's:  Intact  Cognition: WNL  Sleep:   fair   Treatment Plan Summary: Seeking placement.   Pending assessment by [facility]  Disposition: Recommend further treatment - inpatient  Velna HatchetSheila May Agustin AGNP-BC 05/16/2015 2:35 PM Patient seen face-to-face for psychiatric evaluation, chart reviewed and case discussed with the physician extender and developed treatment plan. Reviewed the information documented and agree with the treatment plan. Thedore MinsMojeed Cayetano Mikita, MD

## 2015-06-05 ENCOUNTER — Emergency Department (HOSPITAL_BASED_OUTPATIENT_CLINIC_OR_DEPARTMENT_OTHER)
Admission: EM | Admit: 2015-06-05 | Discharge: 2015-06-05 | Discharge status: Against Medical Advice | Payer: BC Managed Care – PPO | Attending: Emergency Medicine | Admitting: Emergency Medicine

## 2015-06-05 ENCOUNTER — Encounter (HOSPITAL_BASED_OUTPATIENT_CLINIC_OR_DEPARTMENT_OTHER): Payer: Self-pay

## 2015-06-05 DIAGNOSIS — R079 Chest pain, unspecified: Secondary | ICD-10-CM | POA: Diagnosis not present

## 2015-06-05 DIAGNOSIS — I10 Essential (primary) hypertension: Secondary | ICD-10-CM | POA: Diagnosis not present

## 2015-06-05 NOTE — ED Notes (Signed)
Pt came to [facility] stating that she needed to leave and come back later. I informed her she would be forfeiting her visit and would need to check back in if she returns. Pt asked when she would be seen and I informed her that I could not provide her with a definite time. Pt then stated she would not stay. I explained the risks and benefits of her leaving and she signed AMA form. Pt walked to the exit with visitor with quick steady gait in NAD.

## 2015-06-05 NOTE — ED Notes (Signed)
CP x 2 weeks-[facility]

## 2015-06-05 NOTE — ED Notes (Addendum)
Called patient's name and patient was observed walking out the door. RN Benna Dunksaryn at [facility] advised the patient wanted to know how much longer. When the RN could not provide a time, she left the facilty.

## 2017-07-21 IMAGING — CT CT CHEST W/ CM
2 of 3 series · 15 of 36 positions shown, 18 images · IV contrast (omnipaque)
Comparison: 04/26/2015 radiographs

CLINICAL DATA: Worsened cough. Recently diagnosed pneumonia.
Noncompliant with medications.

EXAM:
CT CHEST WITH CONTRAST
TECHNIQUE: Multidetector CT imaging of the chest was performed during
intravenous contrast administration.
CONTRAST:  80mL OMNIPAQUE IOHEXOL 300 MG/ML  SOLN

[Series 2: chest with st · axial · 0.69mm/px · z∈[+1300,+1530]mm · 12 of 54 slices shown, 15 images]
[im 4/54  mediastinal]
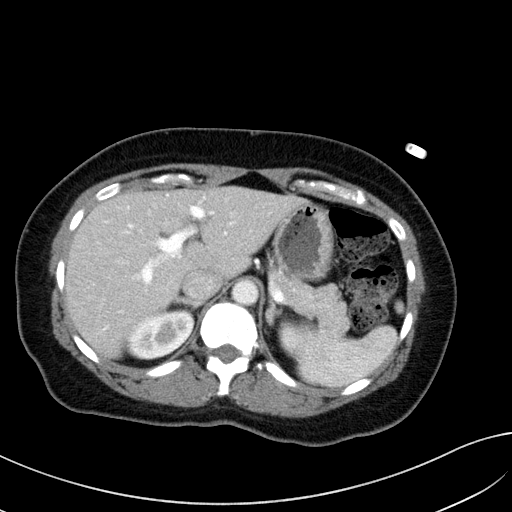
[im 4/54  lung]
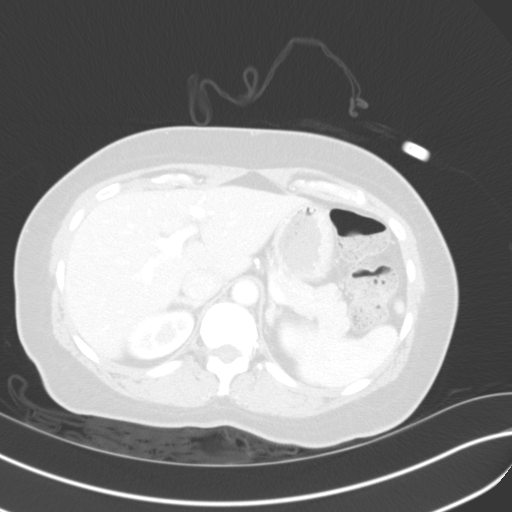
[im 8/54  lung]
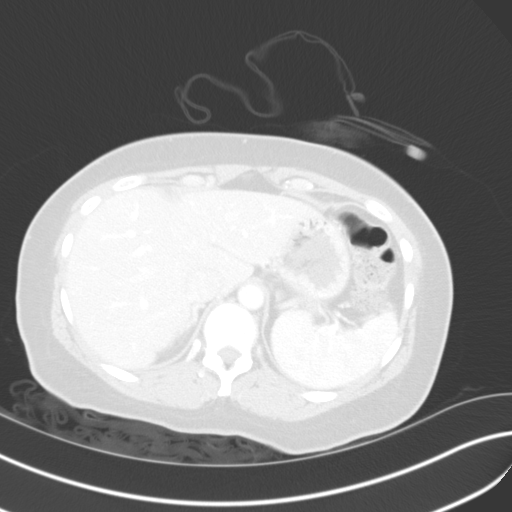
[im 12/54  lung]
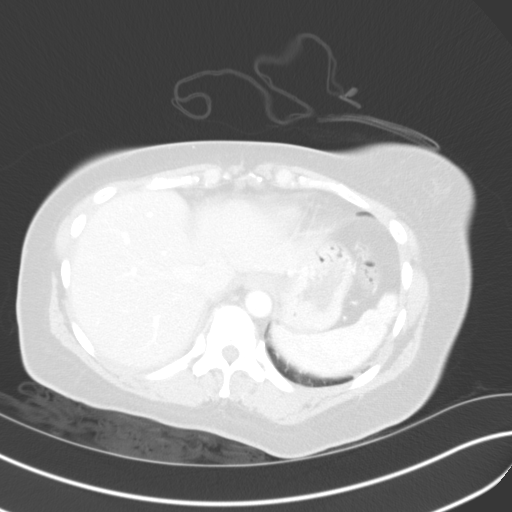
[im 16/54  lung]
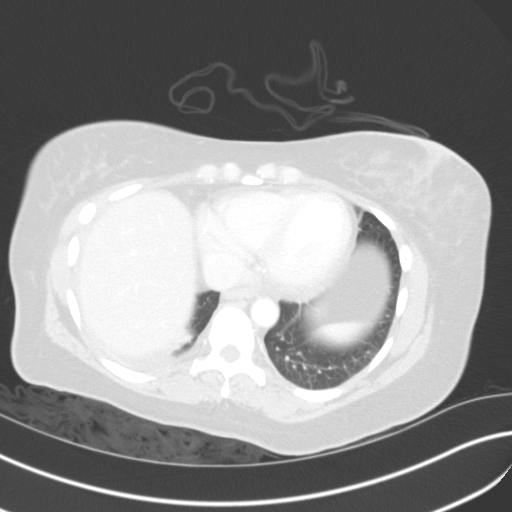
[im 20/54  mediastinal]
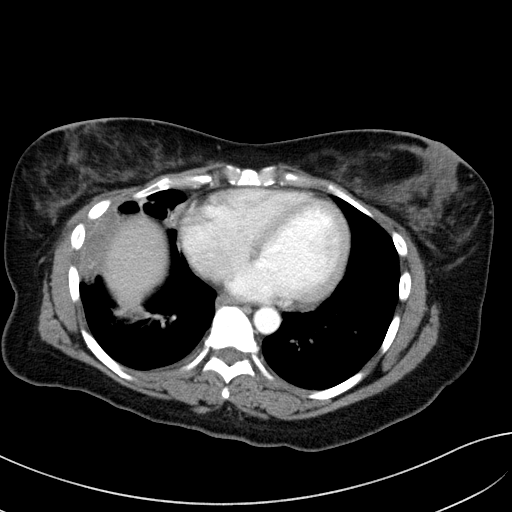
[im 20/54  lung]
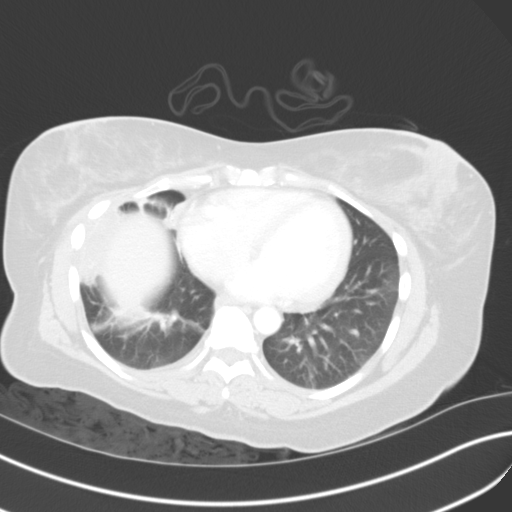
[im 24/54  lung]
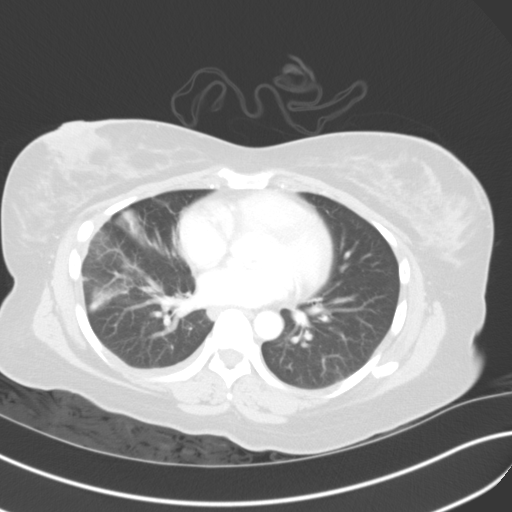
[im 30/54  lung]
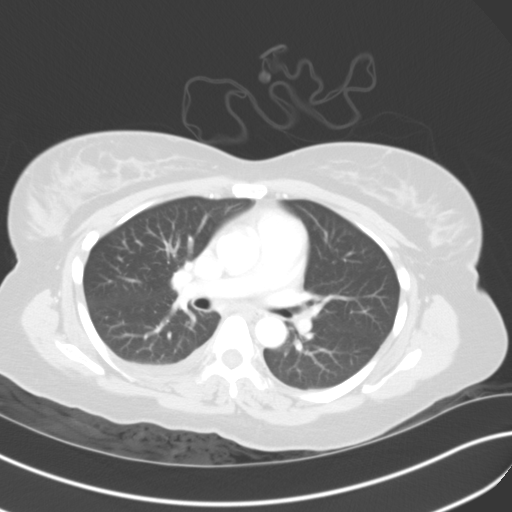
[im 34/54  lung]
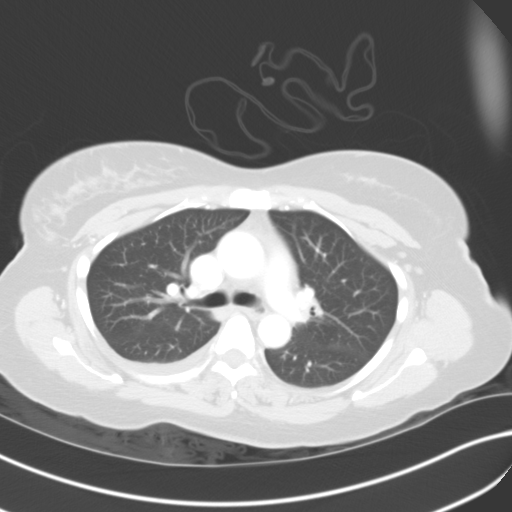
[im 38/54  mediastinal]
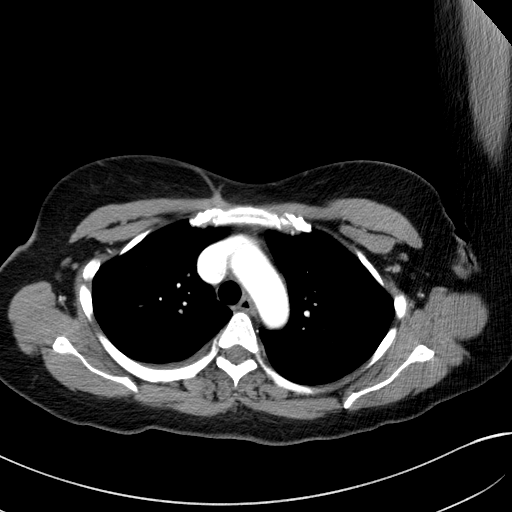
[im 38/54  lung]
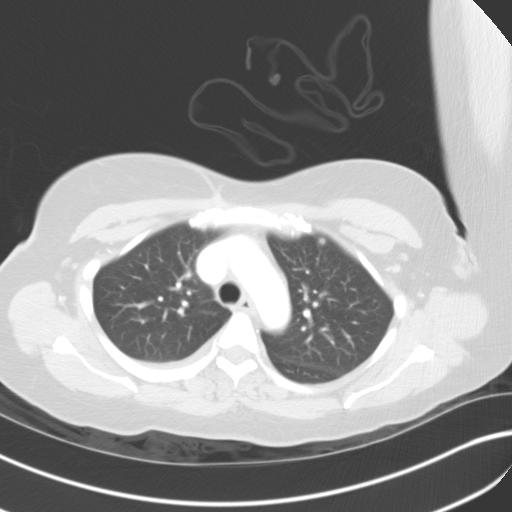
[im 42/54  lung]
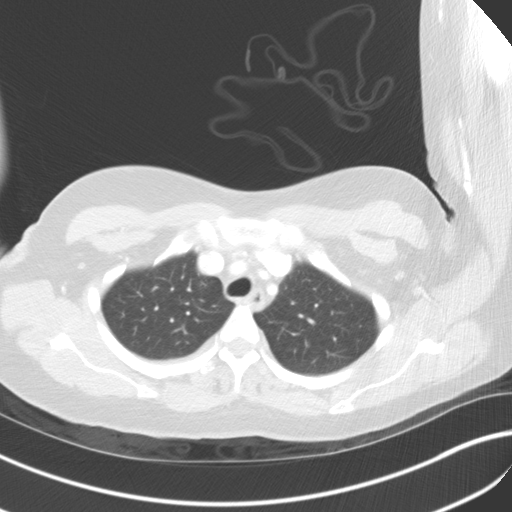
[im 46/54  lung]
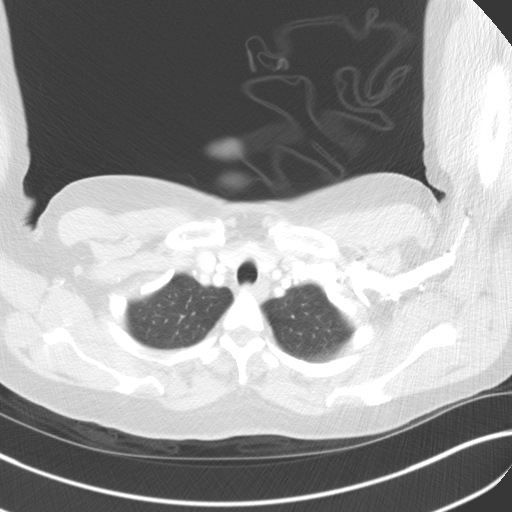
[im 50/54  lung]
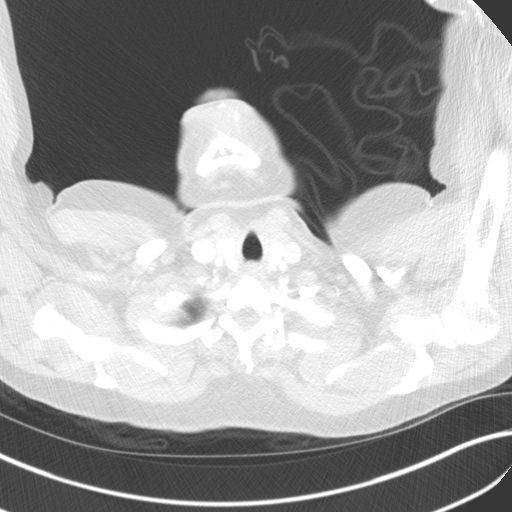

[Series 3: coronals · coronal · 0.59mm/px · 3 of 100 slices shown]
[im 20/100  lung]
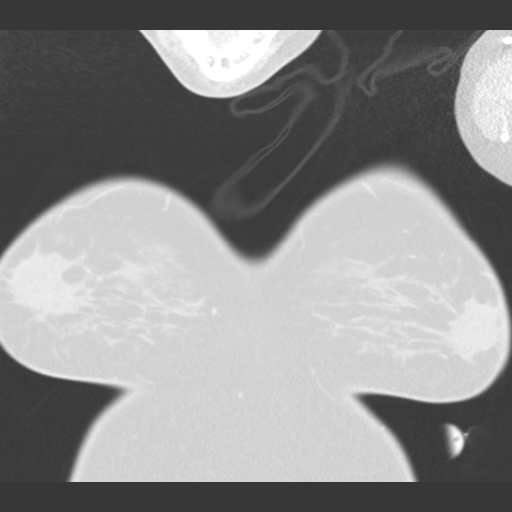
[im 40/100  lung]
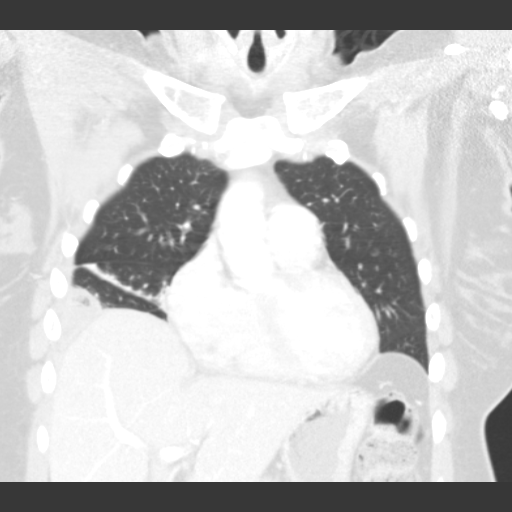
[im 60/100  lung]
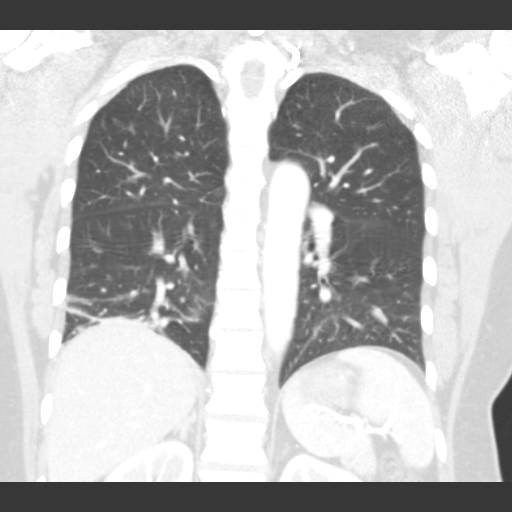

[15 of 36 positions shown; findings below may reference images not displayed]

FINDINGS: There is confluent airspace opacity in the lateral periphery of the
right lower lobe and right middle lobe with air bronchograms. This
is consistent with the clinically described history of pneumonia.
The left lung is clear except for minimal patchy opacity in the
lateral costophrenic angle, axial image 41 series 7. Central airways
are patent. There is a trace right pleural effusion. No pathologic
adenopathy. No significant abnormality in the upper abdomen.
IMPRESSION: Consolidated right lateral base infiltrate consistent with
pneumonia. Trace right pleural effusion.

## 2019-10-05 ENCOUNTER — Ambulatory Visit: Payer: BC Managed Care – PPO | Attending: Family

## 2019-10-05 DIAGNOSIS — Z23 Encounter for immunization: Secondary | ICD-10-CM

## 2019-10-05 NOTE — Progress Notes (Signed)
   Covid-19 Vaccination Clinic  Name:  TANYA CROTHERS    MRN: [id number] DOB: Oct 01, 1965  10/05/2019  Ms. Kable was observed post Covid-19 immunization for 15 minutes without incident. She was provided with Vaccine Information Sheet and instruction to access the V-Safe system.   Ms. Sebring was instructed to call 911 with any severe reactions post vaccine: Marland Kitchen Difficulty breathing  . Swelling of face and throat  . A fast heartbeat  . A bad rash all over body  . Dizziness and weakness   Immunizations Administered    Name Date Dose VIS Date Route   Moderna COVID-19 Vaccine 10/05/2019  3:04 PM 0.5 mL 06/20/2019 Intramuscular   Manufacturer: Moderna   Lot: [id number]   NDC: 93406-840-33

## 2019-11-07 ENCOUNTER — Ambulatory Visit: Payer: BC Managed Care – PPO | Attending: Family

## 2019-11-07 DIAGNOSIS — Z23 Encounter for immunization: Secondary | ICD-10-CM

## 2019-11-07 NOTE — Progress Notes (Signed)
   Covid-19 Vaccination Clinic  Name:  Grace Mack    MRN: [id number] DOB: Nov 13, 1965  11/07/2019  [name] was observed post Covid-19 immunization for 15 minutes without incident. She was provided with Vaccine Information Sheet and instruction to access the V-Safe system.   [name] was instructed to call 911 with any severe reactions post vaccine: Marland Kitchen Difficulty breathing  . Swelling of face and throat  . A fast heartbeat  . A bad rash all over body  . Dizziness and weakness   Immunizations Administered    Name Date Dose VIS Date Route   Moderna COVID-19 Vaccine 11/07/2019 12:56 PM 0.5 mL 06/2019 Intramuscular   Manufacturer: Moderna   Lot: [id number]   NDC: 20355-974-16

## 2020-07-03 ENCOUNTER — Ambulatory Visit: Payer: BC Managed Care – PPO | Attending: Family

## 2020-07-03 DIAGNOSIS — Z23 Encounter for immunization: Secondary | ICD-10-CM

## 2020-10-31 NOTE — Progress Notes (Signed)
   Covid-19 Vaccination Clinic  Name:  MAYBELL MISENHEIMER    MRN: [id number] DOB: 1965-09-01  10/31/2020  Ms. Helder was observed post Covid-19 immunization for 15 minutes without incident. She was provided with Vaccine Information Sheet and instruction to access the V-Safe system.   Ms. Pinheiro was instructed to call 911 with any severe reactions post vaccine: Marland Kitchen Difficulty breathing  . Swelling of face and throat  . A fast heartbeat  . A bad rash all over body  . Dizziness and weakness   Immunizations Administered    Name Date Dose VIS Date Route   Moderna Covid-19 Booster Vaccine 07/03/2020  1:00 PM 0.25 mL 05/08/2020 Intramuscular   Manufacturer: Moderna   Lot: [id number]   NDC: 70263-785-88
